# Patient Record
Sex: Male | Born: 1985 | Race: White | Hispanic: No | Marital: Single | Smoking: Current every day smoker
Health system: Southern US, Community
[De-identification: ages and names within clinical notes are randomized; demographics above are authoritative.]

## PROBLEM LIST (undated history)

## (undated) DIAGNOSIS — I4891 Unspecified atrial fibrillation: Secondary | ICD-10-CM

## (undated) DIAGNOSIS — I456 Pre-excitation syndrome: Secondary | ICD-10-CM

## (undated) DIAGNOSIS — I517 Cardiomegaly: Secondary | ICD-10-CM

## (undated) DIAGNOSIS — R011 Cardiac murmur, unspecified: Secondary | ICD-10-CM

## (undated) HISTORY — PX: CARDIAC SURGERY: SHX584

## (undated) HISTORY — PX: PULMONARY ARTERY BALLOON ANGIOPLASTY: SHX277

## (undated) HISTORY — PX: A FLUTTER ABLATION: SHX5348

## (undated) HISTORY — PX: WISDOM TOOTH EXTRACTION: SHX21

---
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
## 2007-04-03 ENCOUNTER — Ambulatory Visit: Payer: Self-pay | Admitting: Internal Medicine

## 2008-08-01 ENCOUNTER — Ambulatory Visit: Payer: Self-pay | Admitting: Cardiology

## 2008-08-01 ENCOUNTER — Inpatient Hospital Stay (HOSPITAL_COMMUNITY): Admission: EM | Admit: 2008-08-01 | Discharge: 2008-08-02 | Payer: Self-pay | Admitting: Internal Medicine

## 2008-08-01 ENCOUNTER — Encounter: Payer: Self-pay | Admitting: Cardiology

## 2008-08-13 ENCOUNTER — Telehealth: Payer: Self-pay | Admitting: Internal Medicine

## 2008-08-13 DIAGNOSIS — R079 Chest pain, unspecified: Secondary | ICD-10-CM | POA: Insufficient documentation

## 2008-08-13 DIAGNOSIS — R42 Dizziness and giddiness: Secondary | ICD-10-CM

## 2008-08-14 ENCOUNTER — Ambulatory Visit: Payer: Self-pay

## 2008-08-14 ENCOUNTER — Ambulatory Visit: Payer: Self-pay | Admitting: Internal Medicine

## 2008-08-14 ENCOUNTER — Encounter: Payer: Self-pay | Admitting: Internal Medicine

## 2008-08-16 ENCOUNTER — Ambulatory Visit: Payer: Self-pay | Admitting: Internal Medicine

## 2008-08-16 ENCOUNTER — Encounter: Payer: Self-pay | Admitting: Internal Medicine

## 2008-08-16 DIAGNOSIS — I379 Nonrheumatic pulmonary valve disorder, unspecified: Secondary | ICD-10-CM | POA: Insufficient documentation

## 2008-08-16 DIAGNOSIS — I471 Supraventricular tachycardia: Secondary | ICD-10-CM

## 2008-08-26 ENCOUNTER — Telehealth: Payer: Self-pay | Admitting: Internal Medicine

## 2008-09-04 ENCOUNTER — Ambulatory Visit: Payer: Self-pay | Admitting: Internal Medicine

## 2008-09-04 DIAGNOSIS — F411 Generalized anxiety disorder: Secondary | ICD-10-CM | POA: Insufficient documentation

## 2008-09-12 ENCOUNTER — Telehealth: Payer: Self-pay | Admitting: Internal Medicine

## 2008-09-17 ENCOUNTER — Encounter: Payer: Self-pay | Admitting: Internal Medicine

## 2008-09-25 ENCOUNTER — Telehealth: Payer: Self-pay | Admitting: Internal Medicine

## 2008-09-26 ENCOUNTER — Encounter: Payer: Self-pay | Admitting: Internal Medicine

## 2008-10-04 ENCOUNTER — Telehealth: Payer: Self-pay | Admitting: Internal Medicine

## 2008-10-05 ENCOUNTER — Emergency Department (HOSPITAL_COMMUNITY): Admission: EM | Admit: 2008-10-05 | Discharge: 2008-10-06 | Payer: Self-pay | Admitting: Emergency Medicine

## 2008-10-08 ENCOUNTER — Telehealth (INDEPENDENT_AMBULATORY_CARE_PROVIDER_SITE_OTHER): Payer: Self-pay | Admitting: *Deleted

## 2008-10-09 ENCOUNTER — Encounter: Payer: Self-pay | Admitting: Internal Medicine

## 2008-10-10 ENCOUNTER — Ambulatory Visit: Payer: Self-pay | Admitting: Internal Medicine

## 2008-10-11 ENCOUNTER — Telehealth (INDEPENDENT_AMBULATORY_CARE_PROVIDER_SITE_OTHER): Payer: Self-pay | Admitting: *Deleted

## 2008-10-11 ENCOUNTER — Ambulatory Visit (HOSPITAL_COMMUNITY): Admission: RE | Admit: 2008-10-11 | Discharge: 2008-10-11 | Payer: Self-pay | Admitting: Internal Medicine

## 2008-10-11 DIAGNOSIS — R0602 Shortness of breath: Secondary | ICD-10-CM | POA: Insufficient documentation

## 2008-10-14 ENCOUNTER — Encounter: Payer: Self-pay | Admitting: Internal Medicine

## 2008-10-17 ENCOUNTER — Emergency Department (HOSPITAL_COMMUNITY): Admission: EM | Admit: 2008-10-17 | Discharge: 2008-10-18 | Payer: Self-pay | Admitting: Emergency Medicine

## 2008-10-25 ENCOUNTER — Ambulatory Visit: Payer: Self-pay | Admitting: Internal Medicine

## 2008-10-29 ENCOUNTER — Encounter: Payer: Self-pay | Admitting: Internal Medicine

## 2008-11-26 ENCOUNTER — Encounter: Payer: Self-pay | Admitting: Internal Medicine

## 2008-12-03 ENCOUNTER — Encounter: Payer: Self-pay | Admitting: Internal Medicine

## 2008-12-24 ENCOUNTER — Telehealth (INDEPENDENT_AMBULATORY_CARE_PROVIDER_SITE_OTHER): Payer: Self-pay | Admitting: *Deleted

## 2009-06-17 ENCOUNTER — Encounter: Payer: Self-pay | Admitting: Internal Medicine

## 2009-09-06 ENCOUNTER — Inpatient Hospital Stay (HOSPITAL_COMMUNITY): Admission: EM | Admit: 2009-09-06 | Discharge: 2009-09-09 | Payer: Self-pay | Admitting: Emergency Medicine

## 2009-09-12 ENCOUNTER — Emergency Department (HOSPITAL_COMMUNITY): Admission: EM | Admit: 2009-09-12 | Discharge: 2009-09-12 | Payer: Self-pay | Admitting: Emergency Medicine

## 2009-09-22 ENCOUNTER — Inpatient Hospital Stay (HOSPITAL_COMMUNITY): Admission: RE | Admit: 2009-09-22 | Discharge: 2009-09-23 | Payer: Self-pay | Admitting: Orthopedic Surgery

## 2010-02-03 NOTE — Letter (Signed)
Summary: Comprehensive Mental Status Evaluation  Comprehensive Mental Status Evaluation   Imported By: Kassie Mends 01/30/2009 08:06:27  _____________________________________________________________________  External Attachment:    Type:   Image     Comment:   External Document

## 2010-02-03 NOTE — Consult Note (Signed)
Summary: Consultation Report  Consultation Report   Imported By: Marylou Mccoy 01/14/2009 14:07:50  _____________________________________________________________________  External Attachment:    Type:   Image     Comment:   External Document

## 2010-02-03 NOTE — Letter (Signed)
[facility]   Imported By: Frazier Butt Chriscoe 06/30/2009 12:05:33  _____________________________________________________________________  External Attachment:    Type:   Image     Comment:   External Document

## 2010-02-03 NOTE — Letter (Signed)
Summary: [facility] Follow-up Clinic Letter/Office Note  Heart Failure Program Alert Report   Imported By: Roderic Ovens 01/15/2009 12:20:10  _____________________________________________________________________  External Attachment:    Type:   Image     Comment:   External Document

## 2010-02-14 ENCOUNTER — Emergency Department (HOSPITAL_COMMUNITY)
Admission: EM | Admit: 2010-02-14 | Discharge: 2010-02-14 | Disposition: A | Discharge status: Home / Self Care | Payer: Medicaid Other | Attending: Emergency Medicine | Admitting: Emergency Medicine

## 2010-02-14 ENCOUNTER — Emergency Department (HOSPITAL_COMMUNITY): Payer: Medicaid Other

## 2010-02-14 DIAGNOSIS — R002 Palpitations: Secondary | ICD-10-CM | POA: Insufficient documentation

## 2010-02-14 DIAGNOSIS — I456 Pre-excitation syndrome: Secondary | ICD-10-CM | POA: Insufficient documentation

## 2010-02-14 DIAGNOSIS — I4891 Unspecified atrial fibrillation: Secondary | ICD-10-CM | POA: Insufficient documentation

## 2010-02-14 DIAGNOSIS — R110 Nausea: Secondary | ICD-10-CM | POA: Insufficient documentation

## 2010-02-14 DIAGNOSIS — R0609 Other forms of dyspnea: Secondary | ICD-10-CM | POA: Insufficient documentation

## 2010-02-14 DIAGNOSIS — R42 Dizziness and giddiness: Secondary | ICD-10-CM | POA: Insufficient documentation

## 2010-02-14 DIAGNOSIS — R0989 Other specified symptoms and signs involving the circulatory and respiratory systems: Secondary | ICD-10-CM | POA: Insufficient documentation

## 2010-02-14 DIAGNOSIS — R079 Chest pain, unspecified: Secondary | ICD-10-CM | POA: Insufficient documentation

## 2010-02-14 DIAGNOSIS — R000 Tachycardia, unspecified: Secondary | ICD-10-CM | POA: Insufficient documentation

## 2010-02-14 LAB — DIFFERENTIAL
Eosinophils Absolute: 0 10*3/uL (ref 0.0–0.7)
Eosinophils Relative: 0 % (ref 0–5)
Lymphs Abs: 3 10*3/uL (ref 0.7–4.0)
Monocytes Absolute: 0.7 10*3/uL (ref 0.1–1.0)

## 2010-02-14 LAB — POCT CARDIAC MARKERS
CKMB, poc: <1 ng/mL — ABNORMAL LOW (ref 1.0–8.0)
Troponin i, poc: <0.05 ng/mL (ref 0.00–0.09)

## 2010-02-14 LAB — POCT I-STAT, CHEM 8
BUN: 22 mg/dL (ref 6–23)
Calcium, Ion: 1.11 mmol/L — ABNORMAL LOW (ref 1.12–1.32)
Chloride: 107 mEq/L (ref 96–112)
Creatinine, Ser: 1 mg/dL (ref 0.4–1.5)
Glucose, Bld: 86 mg/dL (ref 70–99)
HCT: 43 % (ref 39.0–52.0)
TCO2: 22 mmol/L (ref 0–100)

## 2010-02-14 LAB — CBC
HCT: 40.8 % (ref 39.0–52.0)
MCHC: 33.8 g/dL (ref 30.0–36.0)

## 2010-02-18 ENCOUNTER — Encounter: Payer: Self-pay | Admitting: Internal Medicine

## 2010-02-19 NOTE — Consult Note (Signed)
NAME:  Erik Bryan, Erik Bryan NO.:  [id number]  MEDICAL RECORD NO.:  [id number]           PATIENT TYPE:  E  LOCATION:  [facility]                         FACILITY:  [facility]  PHYSICIAN:  [name]. [name], MD    DATE OF BIRTH:  1985-06-27  DATE OF CONSULTATION:  02/14/2010 DATE OF DISCHARGE:  02/14/2010                                CONSULTATION   ELECTROPHYSIOLOGY/CARDIOLOGY EMERGENCY ROOM CONSULTATION  REQUESTING PHYSICIAN:  Emergency room physician.  INDICATION FOR CONSULTATION:  Evaluation of atrial fibrillation with rapid ventricular response.  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  He also has a history of tricuspid regurgitation. The patient has a history of WPW syndrome and documented SVT and underwent EP study and catheter ablation by Dr. Johney Frame back in July 2010.  At that time, he was found to have a right free wall (paraseptal) accessory pathway which was successfully ablated by his report.  Since then, the patient has had no more rapid SVT, but he has had palpitations and at times has had documented atrial fibrillation.  The patient had atrial fibrillation during his EP study interestingly enough.  The patient was in his usual state of health until several days ago when he noted increasingly frequent episodes of palpitations.  On the date of admission to the emergency room, he was complaining of worsening palpitations and initial EKG demonstrates sinus rhythm, however, subsequent EKGs demonstrated atrial fibrillation with a rapid ventricular response and he is referred now for additional evaluation. The patient denies a history of syncope.  His most recent past medical history is notable for a left bimalleolar ankle fracture, treated by Dr. Ranell Patrick back in September 2011.  His additional past medical history is, otherwise,  unremarkable.  FAMILY HISTORY:  Negative for premature coronary disease.  SOCIAL HISTORY:  The patient denies tobacco or ethanol abuse.  REVIEW OF SYSTEMS:  All systems reviewed and negative except as noted in the HPI.  PHYSICAL EXAMINATION:  GENERAL:  He is a pleasant well-appearing young man, in no distress. VITAL SIGNS:  The blood pressure was 114/82, the pulse was 120 and irregularly irregular, respirations were 18, temperature is 98. HEENT:  Normocephalic and atraumatic.  Pupils equal, round.  Oropharynx is moist.  Sclerae anicteric. NECK:  No jugular distention.  There is no thyromegaly.  Trachea is midline and carotids are 2+ and symmetric. LUNGS:  Clear bilaterally to auscultation.  No wheezes, rales, or rhonchi are present. CARDIOVASCULAR:  Irregularly irregular rhythm with normal S1 and S2. There was a grade 2/6 systolic murmur, heard best at left upper sternal border.  I did not appreciate a diastolic murmur. ABDOMEN:  Soft, nontender.  There is no organomegaly. EXTREMITIES:  No cyanosis, clubbing, or edema.  There was a scar over his left ankle. NEUROLOGIC:  He is alert and oriented x3 with cranial nerves intact. Strength is 5/5 and symmetric.  The EKG demonstrates atrial fibrillation with a rapid ventricular response.  There is no over pre-excitation present.  IMPRESSION: 1. Atrial fibrillation with a  history of Wolff-Parkinson-White     syndrome. 2. Pulmonic insufficiency and stenosis.  DISCUSSION:  The patient has had persistent atrial fibrillation.  I have reviewed his EKG, I do not see any evidence of accessory pathway conduction though he does have an incomplete right bundle-branch block on his EKG.  I have recommended giving the patient flecainide 300 mg p.o. in the emergency room and follow him on the monitor.  He may also require flecainide either taken p.r.n. or on a regular basis at home. The patient will likely revert back to sinus rhythm if he does  not, then we will plan a DC cardioversion over the next few hours.  I will have him see Dr. Johney Frame back in the office in a couple of weeks assuming that we are able to get him back to sinus rhythm.     [name]. [name], MD     GWT/MEDQ  D:  02/14/2010  T:  02/14/2010  Job:  [id number]  Electronically Signed by Lewayne Bunting MD on 02/19/2010 05:45:06 PM

## 2010-03-13 NOTE — Discharge Summary (Signed)
  NAME:  Erik Bryan, LASWELL NO.:  [id number]  MEDICAL RECORD NO.:  [id number]           PATIENT TYPE:  E  LOCATION:  [facility]                         FACILITY:  [facility]  PHYSICIAN:  Pricilla Riffle, MD, FACCDATE OF BIRTH:  Jan 13, 1985  DATE OF ADMISSION:  02/14/2010 DATE OF DISCHARGE:  02/14/2010                              DISCHARGE SUMMARY   IDENTIFICATION:  The patient is a 25 year old with a history of pulmonic stenosis/pulmonic insufficiency, WPW (status post ablation) who presents with atrial fibrillation.  The patient was sedated per Dr. Freida Busman (ER) with 9 mg of Versed IV, 100 mg of fentanyl IV, and 10 mg of etomidate IV.  With pads in the AP position, attempt at cardioversion was made with 200- joule synchronized biphasic energy.  This was unsuccessful.  With pads in the base-apex position, he was again cardioverted with 200-joule biphasic synchronized energy.  This was successful for conversion of sinus rhythm.  A 12-lead EKG pending.     Pricilla Riffle, MD, [facility]     PVR/MEDQ  D:  02/14/2010  T:  02/15/2010  Job:  [id number]  Electronically Signed by Dietrich Pates MD [facility] on 03/12/2010 02:13:08 PM

## 2010-03-17 NOTE — Letter (Signed)
Summary: [facility] Cardiovascual[facility] Clinic Note   [facility] Clinic Note   Imported By: Roderic Ovens 03/12/2010 11:26:53  _____________________________________________________________________  External Attachment:    Type:   Image     Comment:   External Document

## 2010-03-19 LAB — BASIC METABOLIC PANEL
BUN: 10 mg/dL (ref 6–23)
BUN: 10 mg/dL (ref 6–23)
Calcium: 9.8 mg/dL (ref 8.4–10.5)
Chloride: 107 mEq/L (ref 96–112)
GFR calc Af Amer: >60 mL/min (ref >60)
GFR calc Af Amer: >60 mL/min (ref >60)
GFR calc non Af Amer: >60 mL/min (ref >60)
Glucose, Bld: 86 mg/dL (ref 70–99)
Glucose, Bld: 105 mg/dL — ABNORMAL HIGH (ref 70–99)
Potassium: 3.9 mEq/L (ref 3.5–5.1)
Potassium: 3.5 mEq/L (ref 3.5–5.1)
Sodium: 136 mEq/L (ref 135–145)
Sodium: 139 mEq/L (ref 135–145)

## 2010-03-19 LAB — DIFFERENTIAL
Basophils Absolute: 0 10*3/uL (ref 0.0–0.1)
Basophils Absolute: 0 10*3/uL (ref 0.0–0.1)
Eosinophils Absolute: 0.2 10*3/uL (ref 0.0–0.7)
Eosinophils Relative: 2 % (ref 0–5)
Eosinophils Relative: 1 % (ref 0–5)
Lymphocytes Relative: 24 % (ref 12–46)
Lymphs Abs: 2.2 10*3/uL (ref 0.7–4.0)
Lymphs Abs: 2.1 10*3/uL (ref 0.7–4.0)
Monocytes Absolute: 0.5 10*3/uL (ref 0.1–1.0)
Monocytes Relative: 6 % (ref 3–12)
Neutro Abs: 6.2 10*3/uL (ref 1.7–7.7)
Neutrophils Relative %: 69 % (ref 43–77)
Neutrophils Relative %: 69 % (ref 43–77)

## 2010-03-19 LAB — CBC
HCT: 42.1 % (ref 39.0–52.0)
Hemoglobin: 14.1 g/dL (ref 13.0–17.0)
Hemoglobin: 14.6 g/dL (ref 13.0–17.0)
MCH: 29.9 pg (ref 26.0–34.0)
MCHC: 33.7 g/dL (ref 30.0–36.0)
MCHC: 34.6 g/dL (ref 30.0–36.0)
MCV: 88.7 fL (ref 78.0–100.0)
MCV: 89.1 fL (ref 78.0–100.0)
Platelets: 272 10*3/uL (ref 150–400)
Platelets: 194 10*3/uL (ref 150–400)
RBC: 4.71 MIL/uL (ref 4.22–5.81)

## 2010-03-19 LAB — PROTIME-INR: Prothrombin Time: 13.4 seconds (ref 11.6–15.2)

## 2010-03-19 LAB — URINALYSIS, ROUTINE W REFLEX MICROSCOPIC
Hgb urine dipstick: NEGATIVE
Ketones, ur: NEGATIVE mg/dL
Protein, ur: NEGATIVE mg/dL

## 2010-03-19 LAB — APTT: aPTT: 30 seconds (ref 24–37)

## 2010-03-23 ENCOUNTER — Encounter (HOSPITAL_COMMUNITY)
Admission: RE | Admit: 2010-03-23 | Discharge: 2010-03-23 | Disposition: A | Discharge status: Home / Self Care | Payer: Medicaid Other | Source: Ambulatory Visit | Attending: Oral and Maxillofacial Surgery | Admitting: Oral and Maxillofacial Surgery

## 2010-03-23 LAB — CBC
HCT: 44.5 % (ref 39.0–52.0)
Hemoglobin: 15 g/dL (ref 13.0–17.0)
MCH: 30.1 pg (ref 26.0–34.0)
MCV: 89.4 fL (ref 78.0–100.0)
WBC: 6.8 10*3/uL (ref 4.0–10.5)

## 2010-03-23 LAB — PROTIME-INR
INR: 0.94 (ref 0.00–1.49)
Prothrombin Time: 12.8 seconds (ref 11.6–15.2)

## 2010-03-23 LAB — BASIC METABOLIC PANEL
BUN: 8 mg/dL (ref 6–23)
Creatinine, Ser: 1.12 mg/dL (ref 0.4–1.5)
GFR calc Af Amer: >60 mL/min (ref >60)
GFR calc non Af Amer: >60 mL/min (ref >60)
Potassium: 4.8 mEq/L (ref 3.5–5.1)

## 2010-03-24 NOTE — Letter (Addendum)
Summary: Clinic Note - Cardio  Clinic Note - Cardio   Imported By: Earl Many 03/11/2010 09:07:40  _____________________________________________________________________  External Attachment:    Type:   Image     Comment:   External Document  Appended Document: Clinic Note - Cardio Jackie Please send ER consult note  and EKG with afib to T. Bashore  Appended Document: Clinic Note - Cardio Faxed hospital notes and 3 ekg's to [facility] at [phone].

## 2010-03-26 ENCOUNTER — Ambulatory Visit (HOSPITAL_BASED_OUTPATIENT_CLINIC_OR_DEPARTMENT_OTHER)
Admission: RE | Admit: 2010-03-26 | Payer: Medicaid Other | Source: Ambulatory Visit | Admitting: Oral and Maxillofacial Surgery

## 2010-03-26 ENCOUNTER — Ambulatory Visit (HOSPITAL_COMMUNITY)
Admission: RE | Admit: 2010-03-26 | Discharge: 2010-03-26 | Disposition: A | Discharge status: Home / Self Care | Payer: Medicaid Other | Source: Ambulatory Visit | Attending: Oral and Maxillofacial Surgery | Admitting: Oral and Maxillofacial Surgery

## 2010-03-26 DIAGNOSIS — I471 Supraventricular tachycardia, unspecified: Secondary | ICD-10-CM | POA: Insufficient documentation

## 2010-03-26 DIAGNOSIS — K006 Disturbances in tooth eruption: Secondary | ICD-10-CM | POA: Insufficient documentation

## 2010-03-26 DIAGNOSIS — K0530 Chronic periodontitis, unspecified: Secondary | ICD-10-CM | POA: Insufficient documentation

## 2010-04-09 LAB — DIFFERENTIAL
Basophils Absolute: 0 10*3/uL (ref 0.0–0.1)
Basophils Relative: 1 % (ref 0–1)
Eosinophils Absolute: 0.2 10*3/uL (ref 0.0–0.7)
Eosinophils Absolute: 0.2 10*3/uL (ref 0.0–0.7)
Eosinophils Relative: 3 % (ref 0–5)
Lymphs Abs: 2 10*3/uL (ref 0.7–4.0)
Monocytes Absolute: 0.4 10*3/uL (ref 0.1–1.0)
Monocytes Relative: 8 % (ref 3–12)
Neutro Abs: 4.1 10*3/uL (ref 1.7–7.7)
Neutrophils Relative %: 60 % (ref 43–77)

## 2010-04-09 LAB — CBC
MCV: 91.3 fL (ref 78.0–100.0)
Platelets: 169 10*3/uL (ref 150–400)
RBC: 4.43 MIL/uL (ref 4.22–5.81)
RBC: 4.68 MIL/uL (ref 4.22–5.81)
WBC: 6.4 10*3/uL (ref 4.0–10.5)
WBC: 6.8 10*3/uL (ref 4.0–10.5)

## 2010-04-09 LAB — BASIC METABOLIC PANEL
Chloride: 104 mEq/L (ref 96–112)
Creatinine, Ser: 1.12 mg/dL (ref 0.4–1.5)
GFR calc Af Amer: >60 mL/min (ref >60)
Potassium: 3.8 mEq/L (ref 3.5–5.1)

## 2010-04-09 LAB — COMPREHENSIVE METABOLIC PANEL
ALT: 11 U/L (ref 0–53)
AST: 20 U/L (ref 0–37)
Albumin: 4.2 g/dL (ref 3.5–5.2)
Alkaline Phosphatase: 57 U/L (ref 39–117)
Chloride: 109 mEq/L (ref 96–112)
GFR calc Af Amer: >60 mL/min (ref >60)
Potassium: 4 mEq/L (ref 3.5–5.1)
Total Bilirubin: 0.5 mg/dL (ref 0.3–1.2)

## 2010-04-09 LAB — URINALYSIS, ROUTINE W REFLEX MICROSCOPIC
Glucose, UA: NEGATIVE mg/dL
Hgb urine dipstick: NEGATIVE
Specific Gravity, Urine: 1.028 (ref 1.005–1.030)
pH: 6 (ref 5.0–8.0)

## 2010-04-09 LAB — POCT CARDIAC MARKERS: Troponin i, poc: <0.05 ng/mL (ref 0.00–0.09)

## 2010-04-12 LAB — DIFFERENTIAL
Basophils Relative: 0 % (ref 0–1)
Eosinophils Absolute: 0.2 10*3/uL (ref 0.0–0.7)
Monocytes Absolute: 0.5 10*3/uL (ref 0.1–1.0)
Monocytes Relative: 7 % (ref 3–12)

## 2010-04-12 LAB — POCT CARDIAC MARKERS
Myoglobin, poc: 46.9 ng/mL (ref 12–200)
Myoglobin, poc: 49.1 ng/mL (ref 12–200)
Troponin i, poc: <0.05 ng/mL (ref 0.00–0.09)
Troponin i, poc: <0.05 ng/mL (ref 0.00–0.09)

## 2010-04-12 LAB — CBC
Hemoglobin: 14.9 g/dL (ref 13.0–17.0)
MCHC: 35.9 g/dL (ref 30.0–36.0)
MCV: 92.1 fL (ref 78.0–100.0)
RBC: 4.52 MIL/uL (ref 4.22–5.81)

## 2010-04-12 LAB — BASIC METABOLIC PANEL
CO2: 28 mEq/L (ref 19–32)
Chloride: 107 mEq/L (ref 96–112)
GFR calc Af Amer: >60 mL/min (ref >60)
Potassium: 3.7 mEq/L (ref 3.5–5.1)
Sodium: 140 mEq/L (ref 135–145)

## 2010-04-12 LAB — DRUGS OF ABUSE SCREEN W/O ALC, ROUTINE URINE
Amphetamine Screen, Ur: NEGATIVE
Benzodiazepines.: NEGATIVE
Methadone: NEGATIVE
Phencyclidine (PCP): NEGATIVE
Propoxyphene: NEGATIVE

## 2010-04-12 LAB — BRAIN NATRIURETIC PEPTIDE: Pro B Natriuretic peptide (BNP): <30 pg/mL (ref 0.0–100.0)

## 2010-04-23 ENCOUNTER — Ambulatory Visit (HOSPITAL_COMMUNITY)
Admission: RE | Admit: 2010-04-23 | Payer: Medicaid Other | Source: Ambulatory Visit | Admitting: Oral and Maxillofacial Surgery

## 2010-04-30 NOTE — Op Note (Signed)
NAME:  Erik Bryan, Erik Bryan NO.:  [id number]  MEDICAL RECORD NO.:  [id number]           PATIENT TYPE:  O  LOCATION:  [facility]                         FACILITY:  [facility]  PHYSICIAN:  Lincoln Brigham, DDSDATE OF BIRTH:  Oct 23, 1985  DATE OF PROCEDURE:  03/26/2010 DATE OF DISCHARGE:                              OPERATIVE REPORT   PREOPERATIVE DIAGNOSIS:  The patient with impacted third molars, #1, 16, 17, and 32.  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  PROCEDURE:  Extraction of teeth numbers 1, 16, 17, and 32.  INDICATIONS FOR SURGERY:  The patient is a 25 year old white male referred for extraction of 1, 16, 17, and 32 from Dr. Elease Etienne.  The patient has a significant medical history for Wolff-Parkinson-White, which the patient did have ablated several years ago.  However, the patient also has paroxysmal supraventricular tachycardia for which he takes flecainide 100 mg twice daily.  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  SURGEON:  Lincoln Brigham, DDS  PROCEDURE IN DETAIL:  The patient was seen in the preoperative holding area by Anesthesia and myself.  The patient's consent was verified.  The patient's history and physical were updated.  All of the patient's questions were answered.  The patient was then taken to the operating room by anesthesia and intubated nasally and placed under general anesthesia.  At this point, the patient was then turned over to the care of myself.  The patient was then prepped and draped in the usual sterile fashion for oral and maxillofacial surgery procedures.  The patient then had a moistened Ray-Tec placed in his oropharynx.  A time-out  was performed as well.  At this point, a rubber bite block was then placed in the patient's mouth on the right side and then local anesthesia was then provided to the patient, approximately 7.2 mL of 0.5% Marcaine with 1:200,000 epinephrine was used to anesthetize the patient's mouth as well as 10.8 mL of 2% lidocaine with 1:100,000 epinephrine were also used to provide short and long-term local anesthesia into the patient's mouth in the areas of #1, 16, 17, and 32.  At this point, a 15 blade was then used to make a full-thickness mucoperiosteal incision with a distal buccal release in the area of #17.  Periosteal was used to elevate a flap and then a hand piece with a fissure bur was used to create a trough and section the tooth into various pieces.  The crown and all the roots were removed from the extraction site #17.  Copious irrigation was then used along with a curette to clean the socket.  Then, the socket was sutured with 3-0 chromic gut.  This was repeated for teeth numbers 16, 32, and #1.  Of note, only sutures were placed at sites #17 and 32. #1 and 16 did not have sutures placed.  The patient was hemostatic  following the conclusion of the procedure.  The throat pack was then removed from the patient's mouth and his oropharynx was suctioned with Yankauer suction.  At this point, the bite block was removed and 4 x 4's were taken and placed in the patient's mouth to allow for further hemostasis.  All counts were correct at the conclusion of the case x2. The patient was then extubated by anesthesia and taken to the recovery area in stable fashion.  SPECIMENS:  Number 1, 16, 17, and 32 were then discarded.  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  The patient's condition following the procedure is stable.  COMPLICATIONS:  None.          ______________________________ Lincoln Brigham, DDS     CD/MEDQ  D:  03/26/2010  T:   03/27/2010  Job:  [id number]  Electronically Signed by Lincoln Brigham DDS on 04/30/2010 01:58:48 PM

## 2010-04-30 NOTE — Discharge Summary (Signed)
  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  PROCEDURE:  The patient went to the operating room on March 26, 2010, for extraction of numbers 1, 16, 17, and 32.  INDICATIONS FOR SURGERY:  The patient is a 25 year old white male with a history of Wolff-Parkinson-White which was previously ablated, also with a history significant for paroxysmal supraventricular tachycardia for which the patient takes flecainide 100 mg twice daily and the patient was born with several cardiac congenital defects which were treated at his young age and the patient is stable, however, in light of those cardiac histories it was appropriate to take to the operating room for extraction of number 1, 16, 17, and 32.  The patient did well following the procedure.  The patient was then discharged to home on March 26, 2010, without complication.  The patient was given followup in our office 1 week prior to surgery.  The patient was also prescribed prior to his surgery amoxicillin 500 mg.  The patient was to take 1 tablet 3 times daily.  The patient also was prescribed Lorcet 10/325.  The patient was to take 1 tablet every 4 hours as needed for pain along with Peridex oral mouth rinse to use twice daily and Decadron 4 mg which would take 1 tablet 3 times daily x3 days.          ______________________________ Lincoln Brigham, DDS     CD/MEDQ  D:  03/26/2010  T:  03/27/2010  Job:  [id number]  Electronically Signed by Lincoln Brigham DDS on 04/30/2010 01:58:49 PM

## 2010-05-19 NOTE — Op Note (Signed)
NAME:  LYDIA, MENG NO.:  [id number]   MEDICAL RECORD NO.:  [id number]          PATIENT TYPE:  INP   LOCATION:  2012                         FACILITY:  [facility]   PHYSICIAN:  Hillis Range, MD       DATE OF BIRTH:  October 11, 1985   DATE OF PROCEDURE:  DATE OF DISCHARGE:  08/02/2008                               OPERATIVE REPORT   SURGEON:  Hillis Range, MD   PREPROCEDURE DIAGNOSES:  1. Paroxysmal atrial fibrillation.  2. Wolff-Parkinson-White syndrome.   POSTPROCEDURE DIAGNOSES:  1. Paroxysmal atrial fibrillation.  2. Right paraseptal accessory pathway.   PROCEDURES:  1. Comprehensive electrophysiology study.  2. Coronary sinus pacing recording.  3. A 3-D mapping of supraventricular tachycardia.  4. Radiofrequency ablation of supraventricular tachycardia.  5. Arrhythmia induction with pacing.  6. Cardioversion.   INTRODUCTION:  Mr. Schuneman is a pleasant 25 year old gentleman with a  history of pulmonary stenosis and regurgitation who was admitted with  symptomatic palpitations and presyncope.  The patient was found to have  moderate preexcitation by surface EKG, which has not been present on  previous EKGs.  He was observed to have nonsustained atrial fibrillation  on telemetry as well as orthodromic reentrant tachycardia.  He,  therefore, presents for EP study and radiofrequency ablation.   DESCRIPTION OF PROCEDURE:  Informed written consent was obtained, and  the patient was brought to the Electrophysiology Lab in the fasting  state.  He was adequately sedated with intravenous Versed, fentanyl, and  Valium as outlined in the nursing report.  The patient's right neck and  groin were prepped and draped in the usual sterile fashion by the EP lab  staff.  A 6-French curved hexapolar Damato catheter was introduced  through the right internal jugular vein and advanced into the coronary  sinus for recording and pacing from this location.  Two 6-French  quadripolar Josephson catheters were introduced through the right common  femoral vein and advanced into the His bundle and right ventricular apex  positions respectively.  The patient presented to the Electrophysiology  Lab in normal sinus rhythm.  He was noted to have moderate preexcitation  on his surface electrogram.  His PR interval was 107 milliseconds with a  QRS duration of 148 milliseconds.  His QT interval measured 334  milliseconds.  The patient's AH interval measured 69 milliseconds with  an HV interval of 25 milliseconds.  The surface electrogram revealed  preexcitation with a delta wave positive in leads I, II, III, aVF, aVL.  AVR and leads V1 were negative with a transition to positive delta wave  in V2 through V6.  This transition occurred between V1 and V2.  Atrial  pacing was observed, and the patient was found to have maximal  preexcitation when pacing from the proximal coronary sinus at a cycle  length of 700 milliseconds.  There was no AV decrement with atrial  pacing, and the AV Wenckebach cycle length was 340 milliseconds.  Ventricular pacing was performed, which revealed VA conduction at  baseline with the earliest retrograde atrial activation recorded at the  CS56 electrode pair.  Ventricular pacing was performed down to a cycle  length of 300 milliseconds with VA conduction intact throughout.  There  was no significant VA decremental with ventricular pacing.  Atrial  extrastimulus testing was then performed, which revealed that the  antegrade accessory pathway ERP was 500/340 milliseconds.  The AV nodal  ERP was 500/310 milliseconds.  No tachycardias were induced.  Ventricular extrastimulus testing was then performed from the right  ventricular apex position, which revealed that the earliest retrograde  atrial activation was again from the proximal coronary sinus and not the  His electrode pair.  VA pacing was nondecremental, down to 500/340  milliseconds and then  decremental thereafter.  The retrograde ERP of the  accessory pathway was, therefore, felt to be 500/340 milliseconds.  The  retrograde AV nodal ERP was felt to be less than 500/180 milliseconds.  No tachycardias were induced.  I, therefore, elected to perform 3-  dimensional mapping of the right atrium and as well as the tricuspid  valve annulus along the ventricular side.  Atrial pacing was performed  at a cycle length of 700 milliseconds from the proximal coronary sinus  to reveal maximal preexcitation.  The earliest ventricular activation  was found along the tricuspid valve annulus at approximately the 4  o'clock position along the paraseptal region.  In this location, a  slurred activation was observed with the activation preceding the  surface QRS by 7 milliseconds.  Radiofrequency current was delivered and  the accessory pathway, delta wave terminated in 2.8 seconds.  The  radiofrequency application was, therefore, delivered at a target  temperature of 60 watts at 50 degrees.  The initial ablation lesion was  delivered for 29 seconds.  Additional radiofrequency applications were  delivered along this same region as bonus lesions.  During the ablation,  the patient converted to atrial fibrillation.  He remained in atrial  fibrillation but did not have obvious surface preexcitation.  He was  observed in atrial fibrillation, which did not spontaneously terminate.  He was, therefore, successfully cardioverted to sinus rhythm with a  single synchronized 200-joule biphasic shock with cardioversion  electrodes in the anterior-posterior thoracic configuration.  Following  ablation, the HV interval measured 41 milliseconds.  Atrial  extrastimulus testing was then performed, which revealed clearly  decremental AV conduction with no arrhythmias induced.  There were no  clear AH jumps or echo beats.  The AV nodal ERP was 500/300.  Ventricular pacing was performed, which revealed that the  earliest  atrial activation arose from the His distal electrogram.  Ventricular  pacing revealed decremental VA conduction.  Additional radiofrequency  applications were then delivered along the tricuspid valve annulus  between the 4 and 5 o'clock position along the atrial and ventricular  sides at a target temperature of 60 degrees with a power 50 watts for 60  seconds each.  Following ablation, pacing was performed at 900, 800,  700, 600, 500 millisecond cycle lengths without any observed surface  preexcitation.  Rapid atrial pacing was again revealed that the AV  Wenckebach cycle length was 360 milliseconds and no tachycardias were  induced.  Atrial extrastimulus testing was performed, which revealed  decremental AV conduction with an AV nodal ERP of 500/290 milliseconds.  Ventricular pacing was performed, which revealed that the earliest  atrial activation arose from the His electrode.  The VA Wenckebach cycle  length was 270 milliseconds.  Ventricular extrastimulus testing was  again performed, which revealed clearly decremental VA conduction with  no  tachycardias observed.  The retrograde AV nodal ERP was less than  500/200 milliseconds.  The patient was observed for 30 minutes without  return of conduction through the accessory pathway.  At the end of the  procedure, the AH interval measured 76 milliseconds and the HV interval  measured 49 milliseconds.  The procedure was, therefore, considered  completed.  All catheters were removed, and the sheaths were aspirated  and flushed.  The sheaths were removed, and hemostasis was assured.  There were no early apparent complications.   CONCLUSIONS:  1. Sinus rhythm upon presentation with a surface preexcitation      observed.  2. A right paraseptal accessory pathway is observed and successfully      ablated between the 4 and 5 o'clock position along the tricuspid      valve annulus from the ventricular side.  Additional ablation was       performed along the atrial and ventricular insertion sites.  3. No further accessory pathways are observed and no arrhythmias are      inducible following ablation.  4. Atrial fibrillation was induced with ablation and successfully      cardioverted to sinus rhythm.  5. No inducible arrhythmias following ablation.  6. No early apparent complications.      Hillis Range, MD  Electronically Signed     JA/MEDQ  D:  08/02/2008  T:  08/03/2008  Job:  [id number]   cc:   Pricilla Riffle, MD, [facility]
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
## 2010-05-19 NOTE — Assessment & Plan Note (Signed)
[facility]                            CARDIOLOGY OFFICE NOTE   NAME:Erik Bryan, Erik Bryan                     MRN:          [id number]  DATE:04/03/2007                            DOB:          1985-03-11    IDENTIFICATION:  Erik Bryan is a 25 year old with a history of pulmonic  stenosis and moderate pulmonic insufficiency.  I last saw him in  February of last year.   In the interval, he has done okay.  He still works as Personnel officer.  He denies chest pain.  Notes some shortness of breath but with a lot of  exertion, not at other times.   MEDICATIONS:  None.   ALLERGIES:  None.   PHYSICAL EXAMINATION:  The patient is in no distress.  Blood pressure  118/70, pulse is 70 and regular, weight 175.  LUNGS:  Are clear.  No rales or wheezes.  CARDIAC EXAM:  Regular rate and rhythm, grade 2-3/6 systolic murmur  heard best at the left upper sternal border.  No diastolic murmurs are  audible.  No significant RV heave.  ABDOMEN:  Benign.  EXTREMITIES:  No edema.   IMPRESSION:  Pulmonic stenosis.  Again, very important will be an  echocardiogram to evaluate the RV size, function, and the regurgitant  jet.   I have told him to continue activities as tolerated.  I will be in touch  with him again.  He is self-pay, and I will see regarding payment.     Pricilla Riffle, MD, [facility]  Electronically Signed    PVR/MedQ  DD: 04/05/2007  DT: 04/06/2007  Job #: [id number]

## 2010-05-19 NOTE — Consult Note (Signed)
NAME:  Erik Bryan, Erik Bryan NO.:  [id number]   MEDICAL RECORD NO.:  [id number]          PATIENT TYPE:  INP   LOCATION:  2012                         FACILITY:  [facility]   PHYSICIAN:  Hillis Range, MD       DATE OF BIRTH:  1985/10/11   DATE OF CONSULTATION:  DATE OF DISCHARGE:                                 CONSULTATION   REQUESTING PHYSICIAN:  Arturo Morton. Riley Kill, MD, [facility]   REASON FOR CONSULTATION:  Wolff-Parkinson-White syndrome.   HISTORY OF PRESENT ILLNESS:  Mr. Hayter is a pleasant 25 year old  gentleman with a known history of pulmonary valve stenosis and moderate  pulmonary valvular regurgitation who presents with symptoms of  palpitations.  The patient reports being in his usual state of health  until yesterday evening when he developed 5-6 minutes of rapid irregular  palpitations with shortness of breath, chest discomfort, and presyncope.  He notes that the symptoms occurred around 9:00 p.m.  Approximately 3  hours later, he had recurrent palpitations lasting several minutes.  He  is unaware of any triggers or precipitants but does report having an  excessive amount of caffeine lately.  He notes that over the past few  years he has had intermittent palpitations, which typically last less  than a minute.  He has not tried vagal maneuvers in the past.  He denies  any prior episodes of presyncope or syncope.  He is presently resting  comfortably, and otherwise, without complaint.   PAST MEDICAL HISTORY:  1. Moderate pulmonary valvular stenosis and regurgitation.  2. Moderate tricuspid regurgitation.   MEDICINES:  None.   ALLERGIES:  None.   SOCIAL HISTORY:  The patient lives with his girlfriend in [place].  He has a daughter less than 32 years old.  He works in a [employer] in  [place].  He denies tobacco or alcohol use.  He has used marijuana in  the past.   FAMILY HISTORY:  He denies any family history of sudden death or  arrhythmias.   REVIEW OF  SYSTEMS:  All systems are reviewed and negative except as  outlined in the HPI above.   PHYSICAL EXAMINATION:  Telemetry reveals sinus rhythm with no  arrhythmias.  VITAL SIGNS:  Blood pressure 132/80, heart rate 70, respirations 20,  sats 97% on room air, afebrile.  GENERAL:  The patient is a well-appearing male in no acute distress.  He  is alert and oriented x3.  HEENT:  Normocephalic, atraumatic.  Sclerae clear.  Conjunctivae pink.  Oropharynx clear.  NECK:  Supple.  No thyromegaly, JVD, or bruits.  LUNGS:  Clear to auscultation bilaterally.  HEART:  Regular rate and rhythm.  There is a 3/6 diastolic murmur along  the left upper sternal border as well as a 2/6 systolic murmur along the  left upper sternal border.  GI:  Soft, nontender, nondistended.  Positive bowel sounds.  EXTREMITIES:  No clubbing, cyanosis, or edema.  NEUROLOGIC:  Cranial nerves II through XII are intact.  Strength and  sensation are intact.  SKIN:  No ecchymoses or lacerations.  MUSCULOSKELETAL:  No deformity or  atrophy.  PSYCH:  Euthymic mood.  Full affect.   IMAGING:  EKG, I reviewed the patient's EKG from earlier today, which  reveals mild cardiomegaly but no acute pulmonary process.   I have also reviewed the patient's echocardiogram from 2005 which  reveals a preserved ejection fraction with moderate PI, PS, and moderate  TR.  The echocardiogram from today remains pending.   LABORATORY DATA:  Creatinine 1.  White blood cell count 7.4, hematocrit  41, platelets 165.   EKG, I reviewed the patient's EKG from this admission, which reveals  sinus rhythm at 80 beats per minute.  There is a delta wave and  preexcitation is noted.   IMPRESSION:  Mr. Goracke is a 25 year old gentleman who is admitted with  palpitations and presyncope.  He has a preexcitation observed on his  surface EKG.  I am concerned that he has a Wolff-Parkinson-White  syndrome.  This would place him at an incremental risk for sudden  death.  I have advised the patient to consider catheter ablation.  He and his  family have had a long discussion.  They understand that the risk  include but are not limited to bleeding, vascular damage, tamponade,  perforation, damage to the heart, and it is conduction system possibly  requiring a pacemaker, stroke, myocardial infarction, and death.  He  understands these risks and wishes to proceed.  We will, therefore, plan  for electrophysiology study and radiofrequency ablation at the next  available time.      Hillis Range, MD  Electronically Signed     JA/MEDQ  D:  08/01/2008  T:  08/02/2008  Job:  [id number]   cc:   Arturo Morton. Riley Kill, MD, [facility]  Pricilla Riffle, MD, [facility]

## 2010-05-19 NOTE — Letter (Signed)
October 14, 2008     RE:  [name], HIPPERT  MRN:  [id number]  /  DOB:  09-Jan-1985   To whom it may concern:   Mr. Erik Bryan is a pleasant 25 year old gentleman with a history of  Wolff-Parkinson-White syndrome and palpitations.  He is status post EP  study and radiofrequency ablation for Wolff-Parkinson-White syndrome on  August 02, 2008.  Recently, he reports symptoms of palpitations and chest  discomfort.  I am concerned that he may have recurrent arrhythmias.  I  have therefore recommended placement of an event monitor.  Unfortunately, the patient has no insurance and is unable to pay for an  event monitor at this time.  I have agreed to not charge the patient for  profession or technical fees associated with an event monitor if the  monitor could be provided by [employer].  Your assistance would be  greatly appreciated in providing the patient with an event monitor at no  cost at this time.    Sincerely,      Hillis Range, MD  Electronically Signed    JA/MedQ  DD: 10/14/2008  DT: 10/15/2008  Job #: [id number]

## 2010-05-19 NOTE — Discharge Summary (Signed)
NAME:  Erik Bryan, Erik Bryan NO.:  [id number]   MEDICAL RECORD NO.:  [id number]          PATIENT TYPE:  INP   LOCATION:  2012                         FACILITY:  [facility]   PHYSICIAN:  Erik Range, MD       DATE OF BIRTH:  [birth year]   DATE OF ADMISSION:  08/01/2008  DATE OF DISCHARGE:  08/02/2008                               DISCHARGE SUMMARY   This patient has no known drug allergies.   FINAL DIAGNOSES:  1. Admitted with palpitation.      a.     Electrocardiogram shows evidence of pre-excitation.      b.     Marked caffeine intake in the hours preceding palpitation       symptoms.  2. Discharging the day of electrophysiology study with radiofrequency      catheter ablation of a right paraseptal accessory pathway at 4      o'clock on the tricuspid valve annulus.      a.     The patient had atrial fibrillation exhibited during the       radiofrequency applications and required direct current       cardioversion.  3. A 2D echocardiogram on August 01, 2008.      a.     Ejection fraction 40-45%.      b.     Mild stenosis of the pulmonic valve.      c.     Distal right ventricular outflow tract appears narrowed.       There was a mildly elevated gradient across the pulmonic valve.   SECONDARY DIAGNOSES:  1. History of pulmonic valve stenosis/pulmonic valve regurgitation.  2. Echocardiogram in March 2005, demonstrates ejection fraction of 55-      65%.   PROCEDURES:  1. A 2D echocardiogram on August 01, 2008.  Once again ejection fraction      of 40-45% with mild stenosis across the pulmonic valve.  2. On August 02, 2008, electrophysiology study with radiofrequency      catheter ablation of a right heart paraseptal accessory pathway,      Dr. Hillis Bryan.   BRIEF HISTORY:  Mr. Erik Bryan is a 25 year old male.  His only known  medical history is moderate pulmonic valve stenosis, moderate pulmonic  valve regurgitation as well as tricuspid valve regurgitation.  He is  followed  by Dr. Dietrich Bryan.  The patient is currently without any  significant symptoms.  However on the evening of July 31, 2008, the  patient had been helping a friend move.  This was accompanied by copious  amounts of caffeinated beverages.  The patient reports having 5-6  minutes of irregular palpitations, which caused shortness of breath.  He  then had a mild ache in the left substernal chest area.  Symptoms  resolved at that time, but they recurred about midnight with a similar  presentation.  The patient reports having similar symptoms in the past,  but they are much less severe.  The patient presents to the emergency  room at [facility] COURSE:  The patient presents with  two separate episodes of  palpitations and some chest discomfort as well as shortness of breath  during the palpitations.  In both cases, there were self terminated.  Electrocardiogram shows a short PR with slurring of the QRS suggesting  WPW.  The patient also has a 3/6 systolic ejection murmur.  The plan  will be for the patient to have a 2D echocardiogram and  electrophysiology consult.  A 2D echocardiogram was performed on August 01, 2008, that showed a decrease from an echocardiogram done in 2005.  In 2005, ejection fraction 55-65%.  On this admission, ejection fraction  40-45%.  There was mild stenosis in the pulmonic valve.  The patient is  oxygenating well and also showed that the right ventricular outflow  tract appeared narrowed.  He was seen in consultation by Dr. Hillis Bryan.  Dr. Jenel Bryan assessment was that the patient would benefit from  electrophysiology study.  The risks and benefits of that procedure were  described to the patient who wished to proceed.  This was done on August 02, 2008.  He underwent radiofrequency catheter ablation of the right  paraseptal accessory pathway.  He exhibited some atrial fibrillation  during the procedure, which required direct current cardioversion.   There was no return or arrhythmia after ablation.  In the postoperative  period, the patient who did require large amounts of sedation both in  the way of benzodiazepines and narcotic, during the electrophysiology  study complained of awakening with overall myalgias especially at the  right neck.  These were not sufficiently abated with Percocet two  tablets, but he did better with morphine 4 mg IV.  The patient is a  candidate for discharge.  However, on August 02, 2008, he will go home  with a prescription for Ultram 50 mg tablets 2 tablets every 4 hours.  He is also asked to start oral aspirin 81 mg, enteric coated aspirin on  a daily basis for the next 6 weeks.  He follows up at [facility]  with Dr. Dietrich Bryan, on Friday, August 16, 2008, at 4:15.  He sees Dr.  Johney Bryan on Wednesday, September 04, 2008, at 9:45.   Laboratory studies this admission are as follows.  Complete blood count:  White cells 7.4, hemoglobin 14.9, hematocrit 41.6, platelets of 165.  Sodium 140, potassium 3.7, chloride 107, carbonate 28, BUN is 10,  creatinine 1.0, glucose 96.  The BNP is less than 30.  Magnesium was  2.5, troponin I study less than 0.05.  The patient tested positive on a  drug screen for marijuana metabolites.      Erik Bryan, [place]      Erik Range, MD  Electronically Signed    GM/MEDQ  D:  08/02/2008  T:  08/03/2008  Job:  [id number]   cc:   Erik Riffle, MD, [facility]

## 2010-05-22 NOTE — Assessment & Plan Note (Signed)
[name]                            CARDIOLOGY OFFICE NOTE   NAME:Bryan, Erik SZCZEPANIK                     MRN:          [id number]  DATE:03/04/2006                            DOB:          Aug 11, 1985    IDENTIFICATION:  The patient is a 25 year old gentleman who was  previously followed by Lorna Few. It looks he was last seen in  2005. He has a history of pulmonic stenosis that is moderate pulmonic  regurgitation.   Since seen, he has been doing fairly well. He works at [employer].  He does a lot of walking and light lifting. He has no problems with  shortness of breath, dizziness, palpitations, or chest pressure. He does  note, actually, occasional episodes of chest pressure and back pain that  have occurred for a while. They last a couple of seconds and are sharp.  They are not associated with a particular activity.   CURRENT MEDICATIONS:  None.   PAST MEDICAL HISTORY:  As noted above.   SOCIAL HISTORY:  He does not smoke. He does not drink.   REVIEW OF SYSTEMS:  All systems reviewed. Negative to the above problem  except as noted.   FAMILY HISTORY:  Negative for congenital heart disease.   PHYSICAL EXAMINATION:  GENERAL: The patient is in no distress.  VITAL SIGNS: Blood pressure 114/68, pulse 76, weight 174.  HEENT: Normocephalic atraumatic, EO MI, pupils equal, round, and  reactive to light.  LUNGS: Clear.  CARDIAC: Regular rate and rhythm, grade 2-3/6 systolic murmur heard best  at the left upper sternal border. RV is slightly prominent, but chest is  thin.  ABDOMEN: Benign.  EXTREMITIES: Good pulses. Equal onset and no edema.   IMPRESSION:  Pulmonary valve disease. I do not hear the regurgitation on  exam, but again, he will need a follow up echocardiogram to look at his  RV to the PI. We will set tentative follow up for one year. Again, I do  not have any records from [facility]   ADDENDUM   12 lead EKG showed  normal sinus rhythm. 76 beats-per-minute. Right  bundle branch block. Borderline for RVH.     Pricilla Riffle, MD, [facility]  Electronically Signed    PVR/MedQ  DD: 03/04/2006  DT: 03/05/2006  Job #: [id number]

## 2010-12-06 IMAGING — CR DG CHEST 1V PORT
1 series · 1 of 1 positions shown · non-contrast
Comparison: Portable exam 1998 hours compared to 02/21/2003

CLINICAL DATA: Chest pain, shortness of breath, palpitations

PORTABLE CHEST - 1 VIEW

[AP]
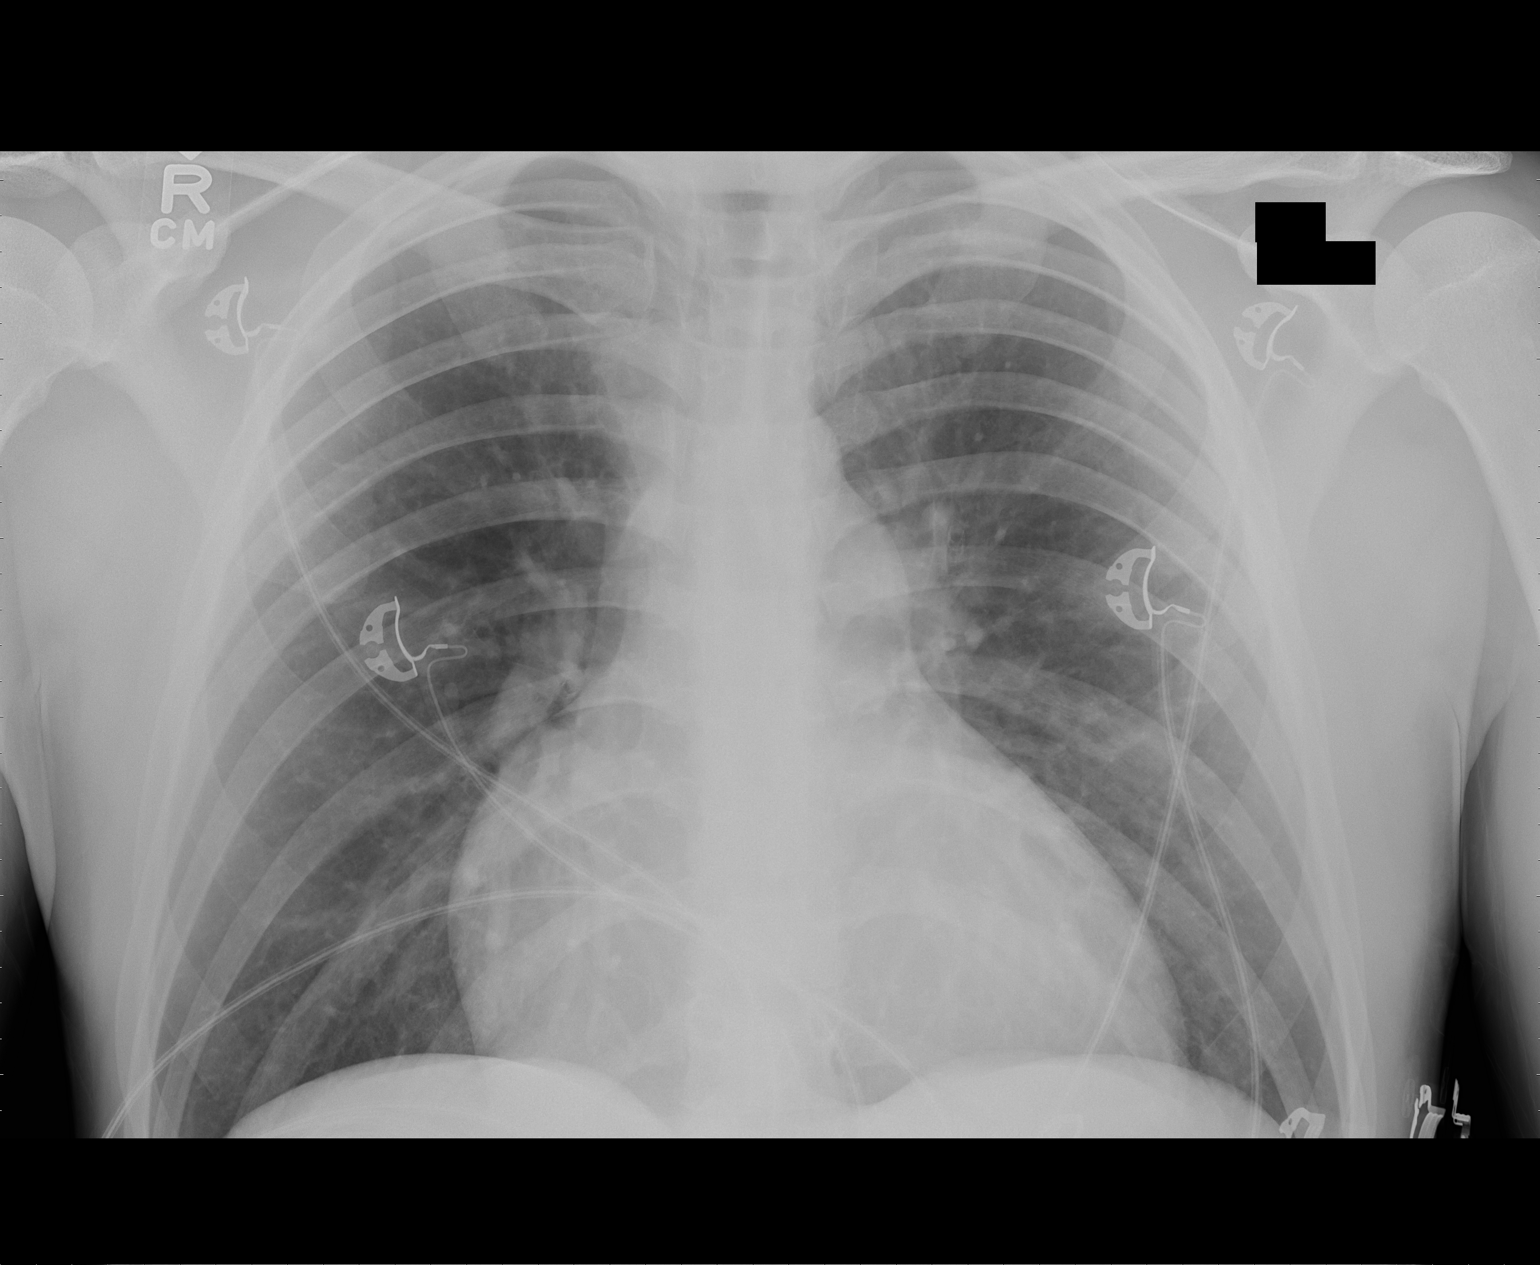

[1 of 1 positions shown; findings below may reference images not displayed]

FINDINGS: Mild cardiac enlargement.
Normal mediastinal contours and pulmonary vascularity.
Lungs clear.
No pleural effusion or pneumothorax.
Cardiac monitoring lines project over chest.
IMPRESSION: Mild cardiac enlargement.

## 2011-01-02 ENCOUNTER — Encounter: Payer: Self-pay | Admitting: *Deleted

## 2011-01-02 ENCOUNTER — Emergency Department (HOSPITAL_COMMUNITY)
Admission: EM | Admit: 2011-01-02 | Discharge: 2011-01-02 | Disposition: A | Discharge status: Home / Self Care | Payer: Medicaid Other | Attending: Emergency Medicine | Admitting: Emergency Medicine

## 2011-01-02 DIAGNOSIS — Z043 Encounter for examination and observation following other accident: Secondary | ICD-10-CM | POA: Insufficient documentation

## 2011-01-02 HISTORY — DX: Unspecified atrial fibrillation: I48.91

## 2011-01-02 HISTORY — DX: Pre-excitation syndrome: I45.6

## 2011-01-02 HISTORY — DX: Cardiac murmur, unspecified: R01.1

## 2011-01-02 MED ORDER — IBUPROFEN 600 MG PO TABS: 600.0000 mg | ORAL_TABLET | Freq: Four times a day (QID) | ORAL | Status: AC | PRN

## 2011-01-02 MED ORDER — DIAZEPAM 5 MG PO TABS: 5.0000 mg | ORAL_TABLET | Freq: Two times a day (BID) | ORAL | Status: AC
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
## 2011-01-03 NOTE — ED Provider Notes (Signed)
Medical screening examination/treatment/procedure(s) were performed by non-physician practitioner and as supervising physician I was immediately available for consultation/collaboration.  Martha K Linker, MD 01/03/11 0711 

## 2011-02-15 IMAGING — CT CT ANGIO CHEST
2 of 6 series · 19 of 36 positions shown · IV contrast (APPLIED)
Comparison: 10/06/2008 chest x-ray

CLINICAL DATA: Chest pain, shortness of breath

CT ANGIOGRAPHY CHEST WITH CONTRAST
TECHNIQUE: Multidetector CT imaging of the chest was performed
using the standard protocol during bolus administration of
intravenous contrast. Multiplanar CT image reconstructions
including MIPs were obtained to evaluate the vascular anatomy.
Contrast: 80 ml Amnipaque-TNN

[Series 6: pe thins @ 1mm · axial · 0.74mm/px · z∈[-330,-34]mm · 18 of 330 slices shown]
[im 17/330  lung]
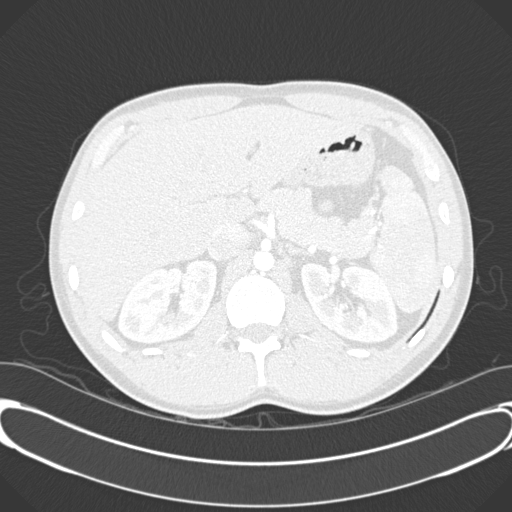
[im 33/330  mediastinal]
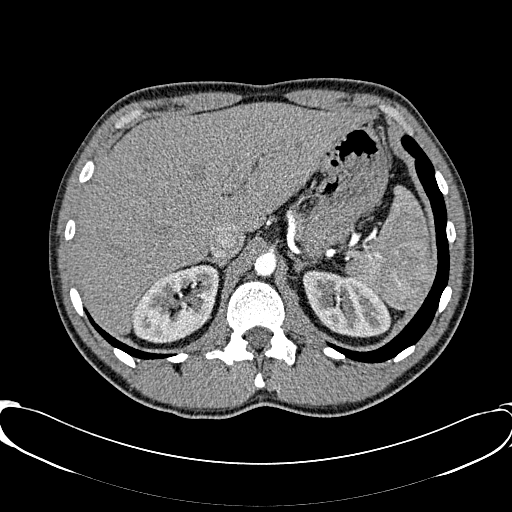
[im 50/330  lung]
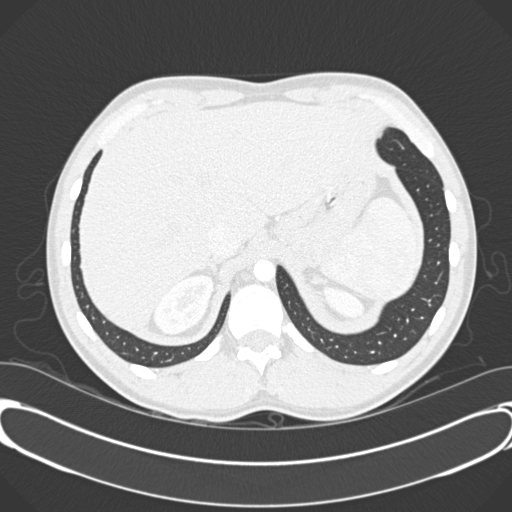
[im 66/330  mediastinal]
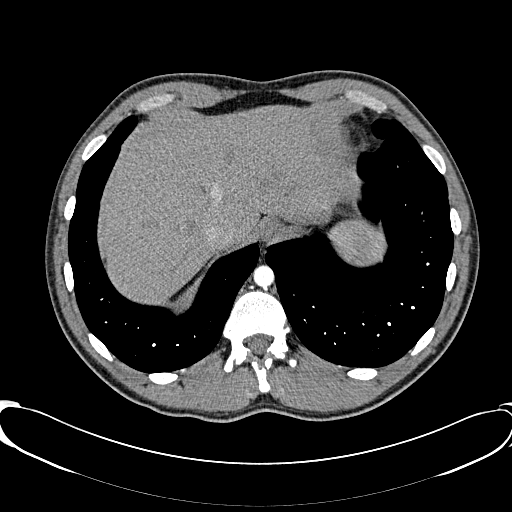
[im 83/330  lung]
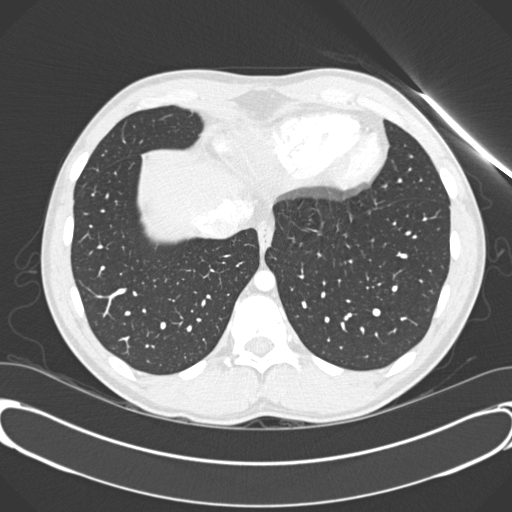
[im 99/330  mediastinal]
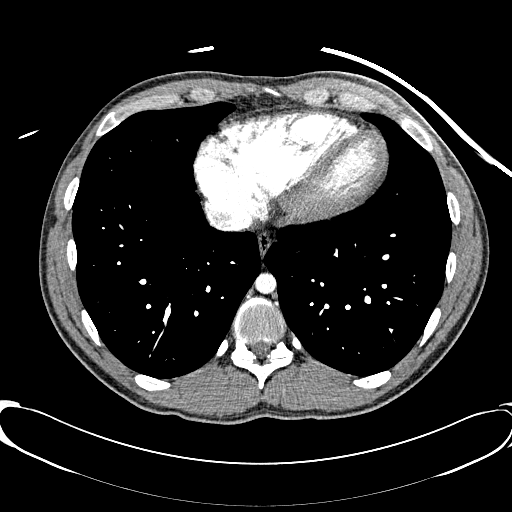
[im 116/330  lung]
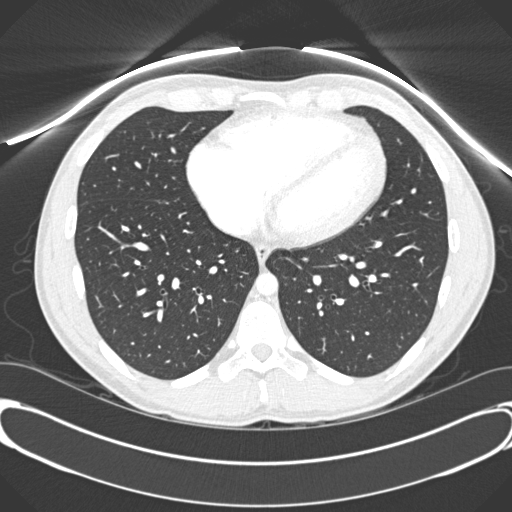
[im 132/330  mediastinal]
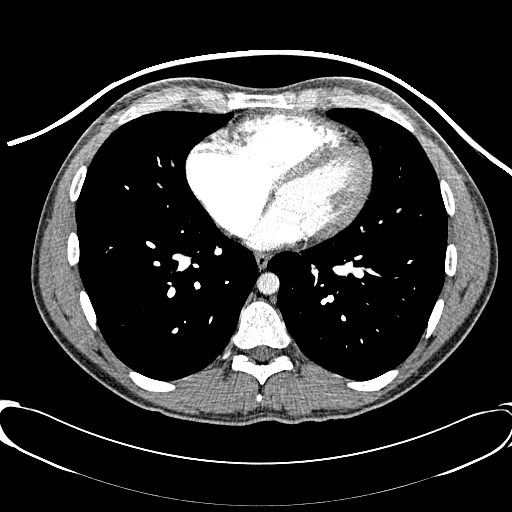
[im 149/330  lung]
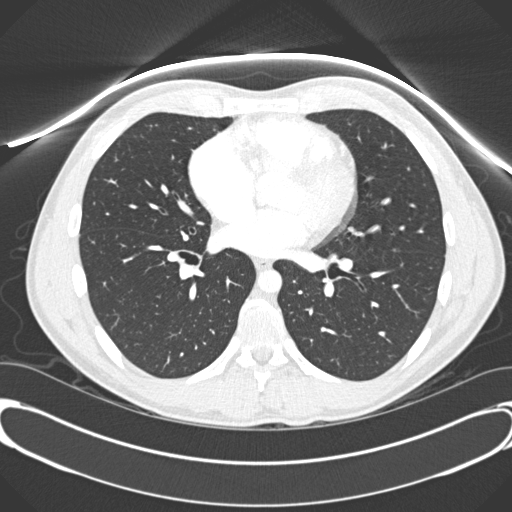
[im 181/330  mediastinal]
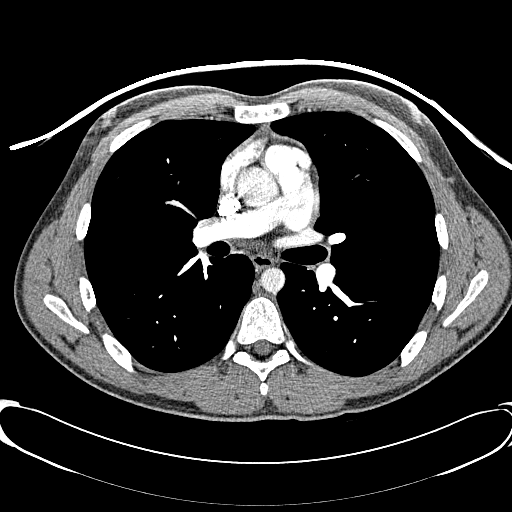
[im 198/330  lung]
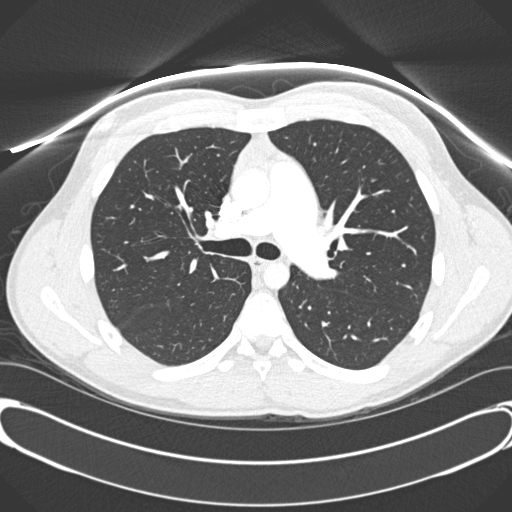
[im 214/330  mediastinal]
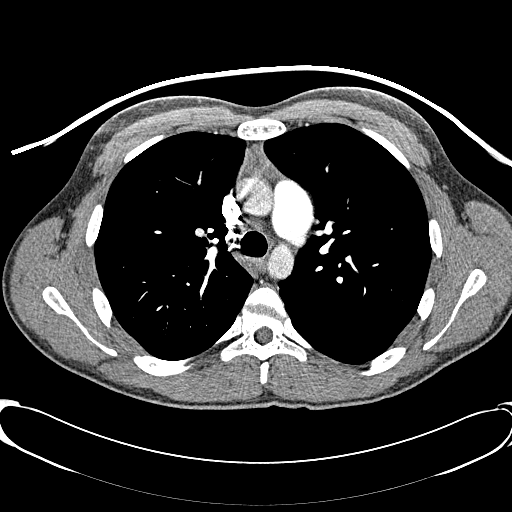
[im 231/330  lung]
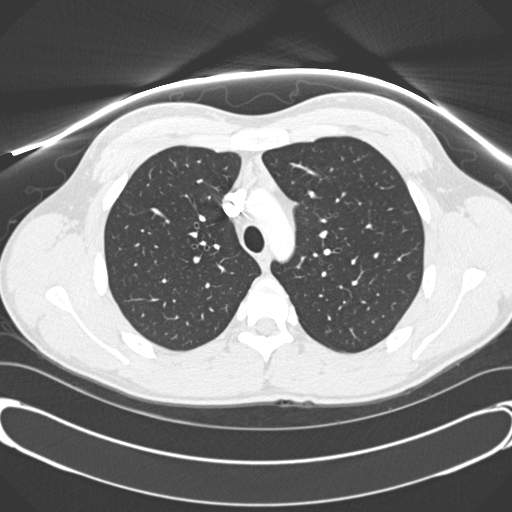
[im 247/330  mediastinal]
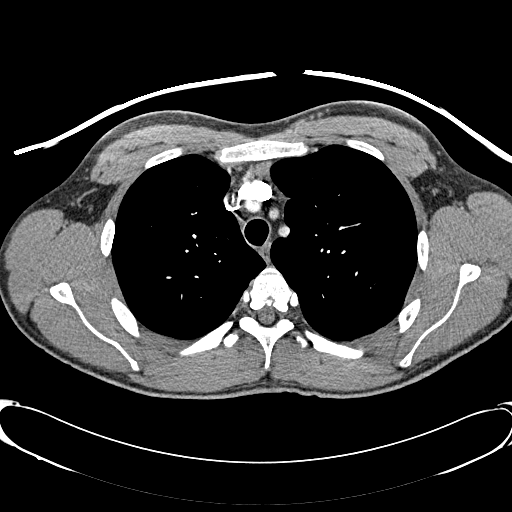
[im 264/330  lung]
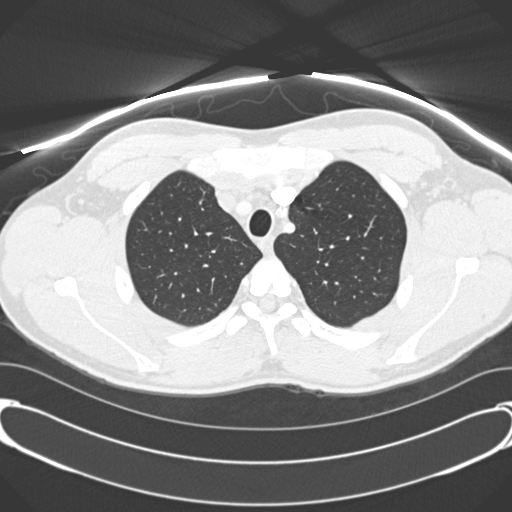
[im 280/330  mediastinal]
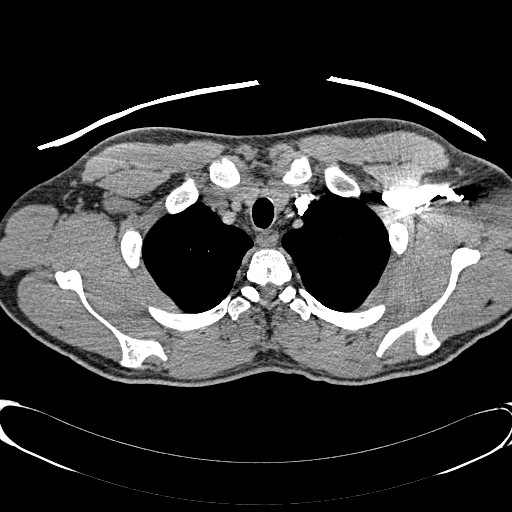
[im 297/330  lung]
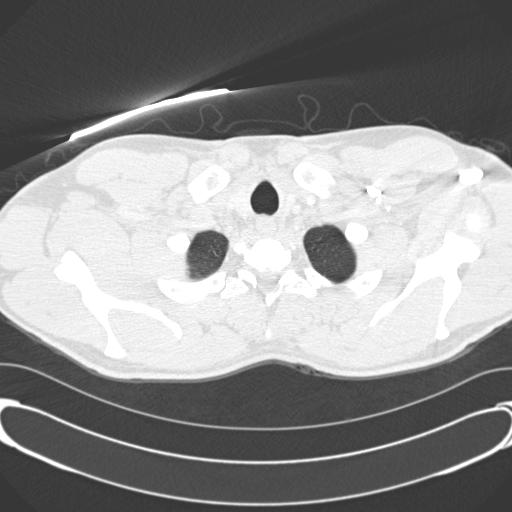
[im 313/330  mediastinal]
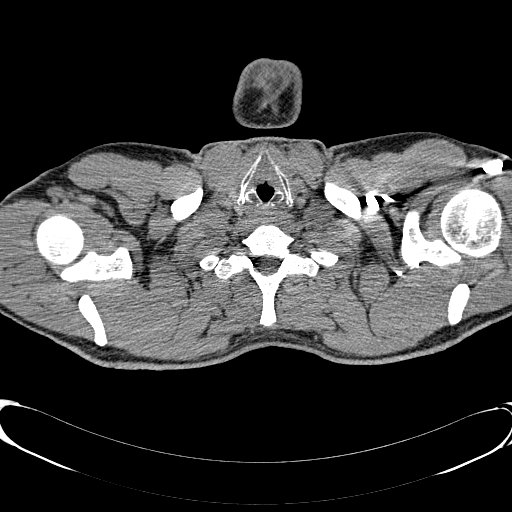

[Series 602: <mpr thick range> · coronal · 0.74mm/px · 1 of 116 slices shown]
[im 58/116  mediastinal]
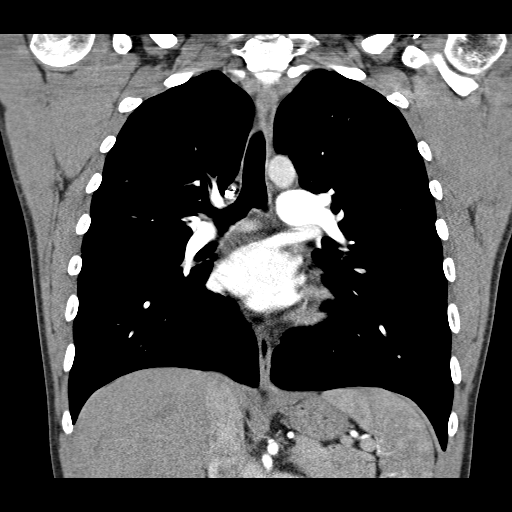

[19 of 36 positions shown; findings below may reference images not displayed]

FINDINGS: Pulmonary arteries are well visualized.  The study is
diagnostic.  No filling defect or acute pulmonary embolus
identified by CTA.  Normal heart size.  No pericardial or pleural
effusion.  No adenopathy.  Triangular soft tissue in the anterior
mediastinum consistent with residual thymic tissue.  No hiatal
hernia.  Imaging of the upper abdomen unremarkable.

Lung windows demonstrate clear lungs.  No focal pneumonia, airspace
disease, edema, interstitial process , or central airway
abnormality.  No pneumothorax or pleural fluid.  No suspicious
pulmonary nodule or mass.

Review of the MIP images confirms the above findings.
IMPRESSION: Negative for acute pulmonary embolus.
No acute intrathoracic process

## 2011-05-13 ENCOUNTER — Emergency Department (HOSPITAL_COMMUNITY)
Admission: EM | Admit: 2011-05-13 | Discharge: 2011-05-13 | Disposition: A | Discharge status: Home / Self Care | Payer: Self-pay | Attending: Emergency Medicine | Admitting: Emergency Medicine

## 2011-05-13 ENCOUNTER — Encounter (HOSPITAL_COMMUNITY): Payer: Self-pay | Admitting: *Deleted

## 2011-05-13 DIAGNOSIS — S30860A Insect bite (nonvenomous) of lower back and pelvis, initial encounter: Secondary | ICD-10-CM | POA: Insufficient documentation

## 2011-05-13 DIAGNOSIS — F17200 Nicotine dependence, unspecified, uncomplicated: Secondary | ICD-10-CM | POA: Insufficient documentation

## 2011-05-13 DIAGNOSIS — I456 Pre-excitation syndrome: Secondary | ICD-10-CM | POA: Insufficient documentation

## 2011-05-13 DIAGNOSIS — R21 Rash and other nonspecific skin eruption: Secondary | ICD-10-CM | POA: Insufficient documentation

## 2011-05-13 DIAGNOSIS — I4891 Unspecified atrial fibrillation: Secondary | ICD-10-CM | POA: Insufficient documentation

## 2011-05-13 DIAGNOSIS — W57XXXA Bitten or stung by nonvenomous insect and other nonvenomous arthropods, initial encounter: Secondary | ICD-10-CM | POA: Insufficient documentation

## 2011-05-13 MED ORDER — AMOXICILLIN 500 MG PO CAPS: 500.0000 mg | ORAL_CAPSULE | Freq: Three times a day (TID) | ORAL | Status: AC

## 2011-05-13 NOTE — ED Provider Notes (Signed)
History     CSN: [id number]  Arrival date & time 05/13/11  1846   First MD Initiated Contact with Patient 05/13/11 2006      Chief Complaint  Patient presents with  . Tick Removal    (Consider location/radiation/quality/duration/timing/severity/associated sxs/prior treatment) HPI Hx from pt. 26yo male who presents with c/o tick bite. [place] and was outside about 5 days ago when he noted a tick biting his R lower abdomen. The tick was not notably engorged, and he was able to remove the entire tick. He does not think that the tick was attached for longer than about 30 minutes. States that since then, he has been feeling "run down, like I have the flu" with myalgias. He has noted two areas of rash which have come up on his abdomen since the bite. Denies fever, chills, chest pain, shortness of breath, nausea/vomiting, abd pain, neck pain.  Past Medical History  Diagnosis Date  . 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 Atrial fibrillation   . Wolf-Parkinson-White syndrome     Past Surgical History  Procedure Date  . Wisdom tooth extraction   . A flutter ablation     No family history on file.  History  Substance Use Topics  . Smoking status: Current Everyday Smoker -- 0.5 packs/day  . Smokeless tobacco: Not on file  . Alcohol Use: Yes     occasionally      Review of Systems  Constitutional: Positive for fatigue. Negative for fever and chills.  HENT: Negative for neck pain and neck stiffness.   Eyes: Negative for photophobia and visual disturbance.  Respiratory: Negative for cough and shortness of breath.   Cardiovascular: Negative for chest pain and palpitations.  Gastrointestinal: Negative for nausea, vomiting, abdominal pain and diarrhea.  Musculoskeletal: Positive for myalgias.  Skin: Positive for rash.  Neurological: Negative for dizziness and weakness.    Allergies  Review of patient's allergies indicates no  known allergies.  Home Medications   Current Outpatient Rx  Name Route Sig Dispense Refill  . ACETAMINOPHEN 325 MG PO TABS Oral Take 650 mg by mouth every 6 (six) hours as needed. Pain    . IBUPROFEN 200 MG PO TABS Oral Take 400 mg by mouth every 6 (six) hours as needed. For pain       BP 120/62  Pulse 95  Temp(Src) 97.7 F (36.5 C) (Oral)  Resp 20  Ht 6' (1.829 m)  Wt 180 lb (81.647 kg)  BMI 24.41 kg/m2  SpO2 100%  Physical Exam  Nursing note and vitals reviewed. Constitutional: He appears well-developed and well-nourished. No distress.  HENT:  Head: Normocephalic and atraumatic.  Mouth/Throat: Oropharynx is clear and moist. No oropharyngeal exudate.  Eyes: EOM are normal. Pupils are equal, round, and reactive to light.  Neck: Normal range of motion. Neck supple.       Full active ROM of neck, no meningeal signs  Cardiovascular: Normal rate and regular rhythm.        Systolic murmur noted (pt has hx of same) No rub noted  Pulmonary/Chest: Effort normal and breath sounds normal. He exhibits no tenderness.  Abdominal: Soft. There is no tenderness. There is no rebound and no guarding.    Musculoskeletal: Normal range of motion.  Lymphadenopathy:    He has no cervical adenopathy.  Neurological: He is alert. No cranial nerve deficit.  Skin: Skin is warm and dry.  He is not diaphoretic.       Skin changes as documented on abd exam  Psychiatric: He has a normal mood and affect. His behavior is normal.    ED Course  Procedures (including critical care time)  Labs Reviewed - No data to display No results found.   1. Tick bite       MDM  Patient presents with myalgias and fatigue since being bitten by a tick on Sunday. He has noticed a rash to his abdomen, which is not directly at the site of the bite. He is afebrile here and nontoxic appearing. He does not appear to have any evidence for complications of Lyme at this time, such as meningitis or pericarditis. However,  the patient does have a history of some type of heart valve problem and arrhythmias. Given this, and the appearance of the rash, feel it would be prudent to treat him as presumed Lyme. Will give him a 3 week course of amoxicillin (he is uninsured so will not rx doxy 2/2 cost). He was given strict return precautions should his symptoms worsen.        Grant Fontana, [place] 05/14/11 [phone]

## 2011-05-13 NOTE — ED Notes (Signed)
Pt states "I removed a tick Sunday or Monday, been feeling like I have the flu; also have some other places I want looked at"

## 2011-05-13 NOTE — Discharge Instructions (Signed)
You may have contracted Lyme disease through your exposure to the tick. We will put you on a course of antibiotics, amoxicillin, to treat this. Please take this as prescribed, until it is gone. Return to the ER if you develop high fever not controlled by medication, neck pain, chest pain, nausea/vomiting, or any other worrisome symptoms.  Lyme Disease You may have been bitten by a tick and are to watch for the development of Lyme Disease. Lyme Disease is an infection that is caused by a bacteria The bacteria causing this disease is named Borreilia burgdorferi. If a tick is infected with this bacteria and then bites you, then Lyme Disease may occur. These ticks are carried by deer and rodents such as rabbits and mice and infest grassy as well as forested areas. Fortunately most tick bites do not cause Lyme Disease.  Lyme Disease is easier to prevent than to treat. First, covering your legs with clothing when walking in areas where ticks are possibly abundant will prevent their attachment because ticks tend to stay within inches of the ground. Second, using insecticides containing DEET can be applied on skin or clothing. Last, because it takes about 12 to 24 hours for the tick to transmit the disease after attachment to the human host, you should inspect your body for ticks twice a day when you are in areas where Lyme Disease is common. You must look thoroughly when searching for ticks. The Ixodes tick that carries Lyme Disease is very small. It is around the size of a sesame seed (picture of tick is not actual size). Removal is best done by grasping the tick by the head and pulling it out. Do not to squeeze the body of the tick. This could inject the infecting bacteria into the bite site. Wash the area of the bite with an antiseptic solution after removal.  Lyme Disease is a disease that may affect many body systems. Because of the small size of the biting tick, most people do not notice being bitten. The first  sign of an infection is usually a round red rash that extends out from the center of the tick bite. The center of the lesion may be blood colored (hemorrhagic) or have tiny blisters (vesicular). Most lesions have bright red outer borders and partial central clearing. This rash may extend out many inches in diameter, and multiple lesions may be present. Other symptoms such as fatigue, headaches, chills and fever, general achiness and swelling of lymph glands may also occur. If this first stage of the disease is left untreated, these symptoms may gradually resolve by themselves, or progressive symptoms may occur because of spread of infection to other areas of the body.  Follow up with your caregiver to have testing and treatment if you have a tick bite and you develop any of the above complaints. Your caregiver may recommend preventative (prophylactic) medications which kill bacteria (antibiotics). Once a diagnosis of Lyme Disease is made, antibiotic treatment is highly likely to cure the disease. Effective treatment of late stage Lyme Disease may require longer courses of antibiotic therapy.  MAKE SURE YOU:   Understand these instructions.   Will watch your condition.   Will get help right away if you are not doing well or get worse.  Document Released: 03/29/2000 Document Revised: 12/10/2010 Document Reviewed: 05/31/2008 [facility] Patient Information 2012 [place], [place].

## 2011-05-16 NOTE — ED Provider Notes (Signed)
Medical screening examination/treatment/procedure(s) were performed by non-physician practitioner and as supervising physician I was immediately available for consultation/collaboration.  Cordaro Mukai, MD 05/16/11 1107 

## 2011-07-23 ENCOUNTER — Emergency Department (HOSPITAL_COMMUNITY): Payer: Self-pay

## 2011-07-23 ENCOUNTER — Emergency Department (HOSPITAL_COMMUNITY)
Admission: EM | Admit: 2011-07-23 | Discharge: 2011-07-23 | Disposition: A | Discharge status: Home / Self Care | Payer: Self-pay | Attending: Emergency Medicine | Admitting: Emergency Medicine

## 2011-07-23 ENCOUNTER — Encounter (HOSPITAL_COMMUNITY): Payer: Self-pay | Admitting: *Deleted

## 2011-07-23 DIAGNOSIS — I4891 Unspecified atrial fibrillation: Secondary | ICD-10-CM | POA: Insufficient documentation

## 2011-07-23 DIAGNOSIS — I456 Pre-excitation syndrome: Secondary | ICD-10-CM | POA: Insufficient documentation

## 2011-07-23 DIAGNOSIS — Y93K1 Activity, walking an animal: Secondary | ICD-10-CM | POA: Insufficient documentation

## 2011-07-23 DIAGNOSIS — S3992XA Unspecified injury of lower back, initial encounter: Secondary | ICD-10-CM

## 2011-07-23 DIAGNOSIS — IMO0002 Reserved for concepts with insufficient information to code with codable children: Secondary | ICD-10-CM | POA: Insufficient documentation

## 2011-07-23 DIAGNOSIS — Y998 Other external cause status: Secondary | ICD-10-CM | POA: Insufficient documentation

## 2011-07-23 DIAGNOSIS — F17200 Nicotine dependence, unspecified, uncomplicated: Secondary | ICD-10-CM | POA: Insufficient documentation

## 2011-07-23 DIAGNOSIS — W010XXA Fall on same level from slipping, tripping and stumbling without subsequent striking against object, initial encounter: Secondary | ICD-10-CM | POA: Insufficient documentation

## 2011-07-23 MED ORDER — IBUPROFEN 800 MG PO TABS
800.0000 mg | ORAL_TABLET | Freq: Once | ORAL | Status: AC
  Administered 2011-07-23: 800 mg via ORAL
  Filled 2011-07-23: qty 1

## 2011-07-23 MED ORDER — HYDROCODONE-ACETAMINOPHEN 5-500 MG PO TABS: 1.0000 | ORAL_TABLET | Freq: Four times a day (QID) | ORAL | Status: AC | PRN

## 2011-07-23 MED ORDER — CYCLOBENZAPRINE HCL 5 MG PO TABS: 5.0000 mg | ORAL_TABLET | Freq: Two times a day (BID) | ORAL | Status: AC | PRN

## 2011-07-23 NOTE — ED Notes (Signed)
Pt states he was walking his dog up a steep hill last night and slipped on wet grass and fell backwards. Pt denies LOC, reports hit head and now c/o back pain.

## 2011-07-23 NOTE — ED Provider Notes (Signed)
History     CSN: [id number]  Arrival date & time 07/23/11  1142   First MD Initiated Contact with Patient 07/23/11 1159      Chief Complaint  Patient presents with  . Fall  . Back Pain    (Consider location/radiation/quality/duration/timing/severity/associated sxs/prior treatment) HPI  Patient presents to the emergency department for evaluation after a fall last night. He states that he was walking up the hill with his dog when he slipped on some wet grass and fell backwards injuring his thoracic spine. He denies loss of consciousness denies any his head but he states that his back is really sore. He is able to walk without any difficulties and denies having any neurological symptoms. He denies any numbness or tingling in his extremities and he denies bowel or urinary incontinence.  Past Medical History  Diagnosis Date  . 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 Atrial fibrillation   . Wolf-Parkinson-White syndrome     Past Surgical History  Procedure Date  . Wisdom tooth extraction   . A flutter ablation     History reviewed. No pertinent family history.  History  Substance Use Topics  . Smoking status: Current Everyday Smoker -- 0.5 packs/day  . Smokeless tobacco: Not on file  . Alcohol Use: Yes     occasionally      Review of Systems   HEENT: denies blurry vision or change in hearing PULMONARY: Denies difficulty breathing and SOB CARDIAC: denies chest pain or heart palpitations MUSCULOSKELETAL:  denies being unable to ambulate ABDOMEN AL: denies abdominal pain GU: denies loss of bowel or urinary control NEURO: denies numbness and tingling in extremities SKIN: no new rashes PSYCH: patient denies anxiety or depression. NECK: Pt denies having neck pain     Allergies  Review of patient's allergies indicates no known allergies.  Home Medications   Current Outpatient Rx  Name Route Sig Dispense Refill  . ACETAMINOPHEN 325  MG PO TABS Oral Take 650 mg by mouth every 6 (six) hours as needed. Pain    . IBUPROFEN 200 MG PO TABS Oral Take 400 mg by mouth every 6 (six) hours as needed. For pain     . CYCLOBENZAPRINE HCL 5 MG PO TABS Oral Take 1 tablet (5 mg total) by mouth 2 (two) times daily as needed for muscle spasms. 20 tablet 0  . HYDROCODONE-ACETAMINOPHEN 5-500 MG PO TABS Oral Take 1 tablet by mouth every 6 (six) hours as needed for pain. 15 tablet 0    BP 129/71  Pulse 79  Temp 98.5 F (36.9 C) (Oral)  Resp 16  Ht 5\' 10"  (1.778 m)  Wt 175 lb (79.379 kg)  BMI 25.11 kg/m2  SpO2 100%  Physical Exam  Nursing note and vitals reviewed. Constitutional: He appears well-developed and well-nourished. No distress.  HENT:  Head: Normocephalic and atraumatic.  Eyes: Pupils are equal, round, and reactive to light.  Neck: Normal range of motion. Neck supple.  Cardiovascular: Normal rate and regular rhythm.   Pulmonary/Chest: Effort normal.  Abdominal: Soft.  Musculoskeletal:       Thoracic back: He exhibits tenderness. He exhibits normal range of motion.       Back:        Equal strength to bilateral lower extremities. Neurosensory function adequate to both legs. Skin color is normal. Skin is warm and moist. I see no step off deformity, no bony tenderness. Pt is able to ambulate without  limp. Pain is relieved when sitting in certain positions. ROM is decreased due to pain. No crepitus, laceration, effusion, swelling.  Pulses are normal   Neurological: He is alert.  Skin: Skin is warm and dry.    ED Course  Procedures (including critical care time)  Labs Reviewed - No data to display Dg Thoracic Spine 2 View  07/23/2011  *RADIOLOGY REPORT*  Clinical Data: Larey Seat.  Pain.  THORACIC SPINE - 2 VIEW  Comparison: 02/14/2010  Findings: Alignment is normal.  No fracture.  No paravertebral swelling.  Posteromedial ribs appear normal.  IMPRESSION: Negative radiographs  Original Report Authenticated By: Thomasenia Sales,  M.D.     1. Back injury       MDM  Patient with back pain. No neurological deficits. Patient is ambulatory. No warning symptoms of back pain including: loss of bowel or bladder control, night sweats, waking from sleep with back pain, unexplained fevers or weight loss, h/o cancer, IVDU, recent trauma. No concern for cauda equina, epidural abscess, or other serious cause of back pain. Conservative measures such as rest, ice/heat and pain medicine indicated with PCP follow-up if no improvement with conservative management.           Dorthula Matas, PA 07/23/11 1325

## 2011-07-24 NOTE — ED Provider Notes (Signed)
Medical screening examination/treatment/procedure(s) were performed by non-physician practitioner and as supervising physician I was immediately available for consultation/collaboration.   Suzi Roots, MD 07/24/11 [phone]

## 2011-09-07 ENCOUNTER — Emergency Department (HOSPITAL_COMMUNITY): Payer: Self-pay

## 2011-09-07 ENCOUNTER — Encounter (HOSPITAL_COMMUNITY): Payer: Self-pay | Admitting: Emergency Medicine

## 2011-09-07 ENCOUNTER — Emergency Department (HOSPITAL_COMMUNITY)
Admission: EM | Admit: 2011-09-07 | Discharge: 2011-09-07 | Disposition: A | Discharge status: Home / Self Care | Payer: Self-pay | Attending: Emergency Medicine | Admitting: Emergency Medicine

## 2011-09-07 DIAGNOSIS — F17200 Nicotine dependence, unspecified, uncomplicated: Secondary | ICD-10-CM | POA: Insufficient documentation

## 2011-09-07 DIAGNOSIS — S6990XA Unspecified injury of unspecified wrist, hand and finger(s), initial encounter: Secondary | ICD-10-CM | POA: Insufficient documentation

## 2011-09-07 DIAGNOSIS — Y92009 Unspecified place in unspecified non-institutional (private) residence as the place of occurrence of the external cause: Secondary | ICD-10-CM | POA: Insufficient documentation

## 2011-09-07 DIAGNOSIS — I456 Pre-excitation syndrome: Secondary | ICD-10-CM | POA: Insufficient documentation

## 2011-09-07 DIAGNOSIS — W208XXA Other cause of strike by thrown, projected or falling object, initial encounter: Secondary | ICD-10-CM | POA: Insufficient documentation

## 2011-09-07 DIAGNOSIS — I4891 Unspecified atrial fibrillation: Secondary | ICD-10-CM | POA: Insufficient documentation

## 2011-09-07 MED ORDER — IBUPROFEN 600 MG PO TABS: 600.0000 mg | ORAL_TABLET | Freq: Four times a day (QID) | ORAL | Status: AC | PRN

## 2011-09-07 MED ORDER — OXYCODONE-ACETAMINOPHEN 5-325 MG PO TABS
1.0000 | ORAL_TABLET | Freq: Once | ORAL | Status: AC
  Administered 2011-09-07: 1 via ORAL
  Filled 2011-09-07: qty 1

## 2011-09-07 MED ORDER — OXYCODONE-ACETAMINOPHEN 5-325 MG PO TABS: 2.0000 | ORAL_TABLET | ORAL | Status: AC | PRN

## 2011-09-07 MED ORDER — IBUPROFEN 800 MG PO TABS
800.0000 mg | ORAL_TABLET | Freq: Once | ORAL | Status: AC
Start: 2011-09-07 — End: 2011-09-07
  Administered 2011-09-07: 800 mg via ORAL
  Filled 2011-09-07: qty 1

## 2011-09-07 NOTE — ED Notes (Signed)
Pt was stacking firewood when a log fell on his right hand.  Area is reddened and painful.  Slight swelling to the area.

## 2011-09-07 NOTE — ED Provider Notes (Signed)
History     CSN: [id number]  Arrival date & time 09/07/11  1517   First MD Initiated Contact with Patient 09/07/11 1727      Chief Complaint  Patient presents with  . Hand Injury    (Consider location/radiation/quality/duration/timing/severity/associated sxs/prior treatment) Patient is a 26 y.o. male presenting with hand injury. The history is provided by the patient. No language interpreter was used.  Hand Injury  The incident occurred 3 to 5 hours ago. The incident occurred at home. The injury mechanism was a direct blow. The pain is present in the right hand. The quality of the pain is described as aching. The pain is at a severity of 5/10. The pain is mild. The pain has been constant since the incident. Pertinent negatives include no fever. He reports no foreign bodies present. He has tried NSAIDs for the symptoms. The treatment provided mild relief.  Stack of wood fell on R hand patient is R handed.  Slightly swollen.  Good sensation and movement to fingers.  Past Medical History  Diagnosis Date  . 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 Atrial fibrillation   . Wolf-Parkinson-White syndrome     Past Surgical History  Procedure Date  . Wisdom tooth extraction   . A flutter ablation     History reviewed. No pertinent family history.  History  Substance Use Topics  . Smoking status: Current Everyday Smoker -- 0.5 packs/day  . Smokeless tobacco: Not on file  . Alcohol Use: Yes     occasionally      Review of Systems  Constitutional: Negative.  Negative for fever.  HENT: Negative.   Eyes: Negative.   Respiratory: Negative.   Cardiovascular: Negative.   Gastrointestinal: Negative.   Musculoskeletal:       R hand pain swelling  Neurological: Negative.   Psychiatric/Behavioral: Negative.   All other systems reviewed and are negative.    Allergies  Review of patient's allergies indicates no known allergies.  Home Medications    Current Outpatient Rx  Name Route Sig Dispense Refill  . IBUPROFEN 200 MG PO TABS Oral Take 400 mg by mouth every 6 (six) hours as needed. For pain       BP 120/64  Pulse 90  Temp 98 F (36.7 C)  Resp 16  Wt 180 lb (81.647 kg)  SpO2 99%  Physical Exam  Nursing note and vitals reviewed. Constitutional: He is oriented to person, place, and time. He appears well-developed and well-nourished.  HENT:  Head: Normocephalic.  Eyes: Conjunctivae and EOM are normal. Pupils are equal, round, and reactive to light.  Neck: Normal range of motion. Neck supple.  Cardiovascular: Normal rate.   Pulmonary/Chest: Effort normal.  Abdominal: Soft.  Musculoskeletal: Normal range of motion. He exhibits edema and tenderness.       R hand tenderness slightly swollen 2+ radial pulse good sensation and movement  Neurological: He is alert and oriented to person, place, and time.  Skin: Skin is warm and dry.  Psychiatric: He has a normal mood and affect.    ED Course  Procedures (including critical care time)  Labs Reviewed - No data to display Dg Hand Complete Right  09/07/2011  *RADIOLOGY REPORT*  Clinical Data: Right fourth MCP joint pain.  Hand trauma.  RIGHT HAND - COMPLETE 3+ VIEW  Comparison: None.  Findings: No radiopaque foreign body is identified.  Alignment of the bones of the hand is anatomic.  Bandages present on the long finger.  Healed boxers fracture of the fifth metacarpal. Metacarpal bases appear within normal limits.  IMPRESSION: No acute osseous abnormality.   Original Report Authenticated By: Andreas Newport, M.D.      No diagnosis found.    MDM  Which fell on his right hand today with an injury. X-rays negative for fracture. We will put a wrist splint on this hand to remind him not to use it until it feels better. Ibuprofen and Percocet as needed for pain ice and elevate tonight. Followup with or so if not better in 7-10 days.        Remi Haggard, NP 09/07/11 1753

## 2011-09-09 NOTE — ED Provider Notes (Signed)
Medical screening examination/treatment/procedure(s) were performed by non-physician practitioner and as supervising physician I was immediately available for consultation/collaboration.  Raeford Razor, MD 09/09/11 [phone]

## 2012-01-11 IMAGING — DX DG ANKLE COMPLETE 3+V*L*
1 series · 3 of 3 positions shown · non-contrast
Comparison: Plain films left ankle [DATE] 11[birth year]19/1935.

CLINICAL DATA: Post reduction films after fracture dislocation.

LEFT ANKLE COMPLETE - 3+ VIEW

[Series 1: series 1 · left · 3 of 3 slices shown]
[im 1/3]
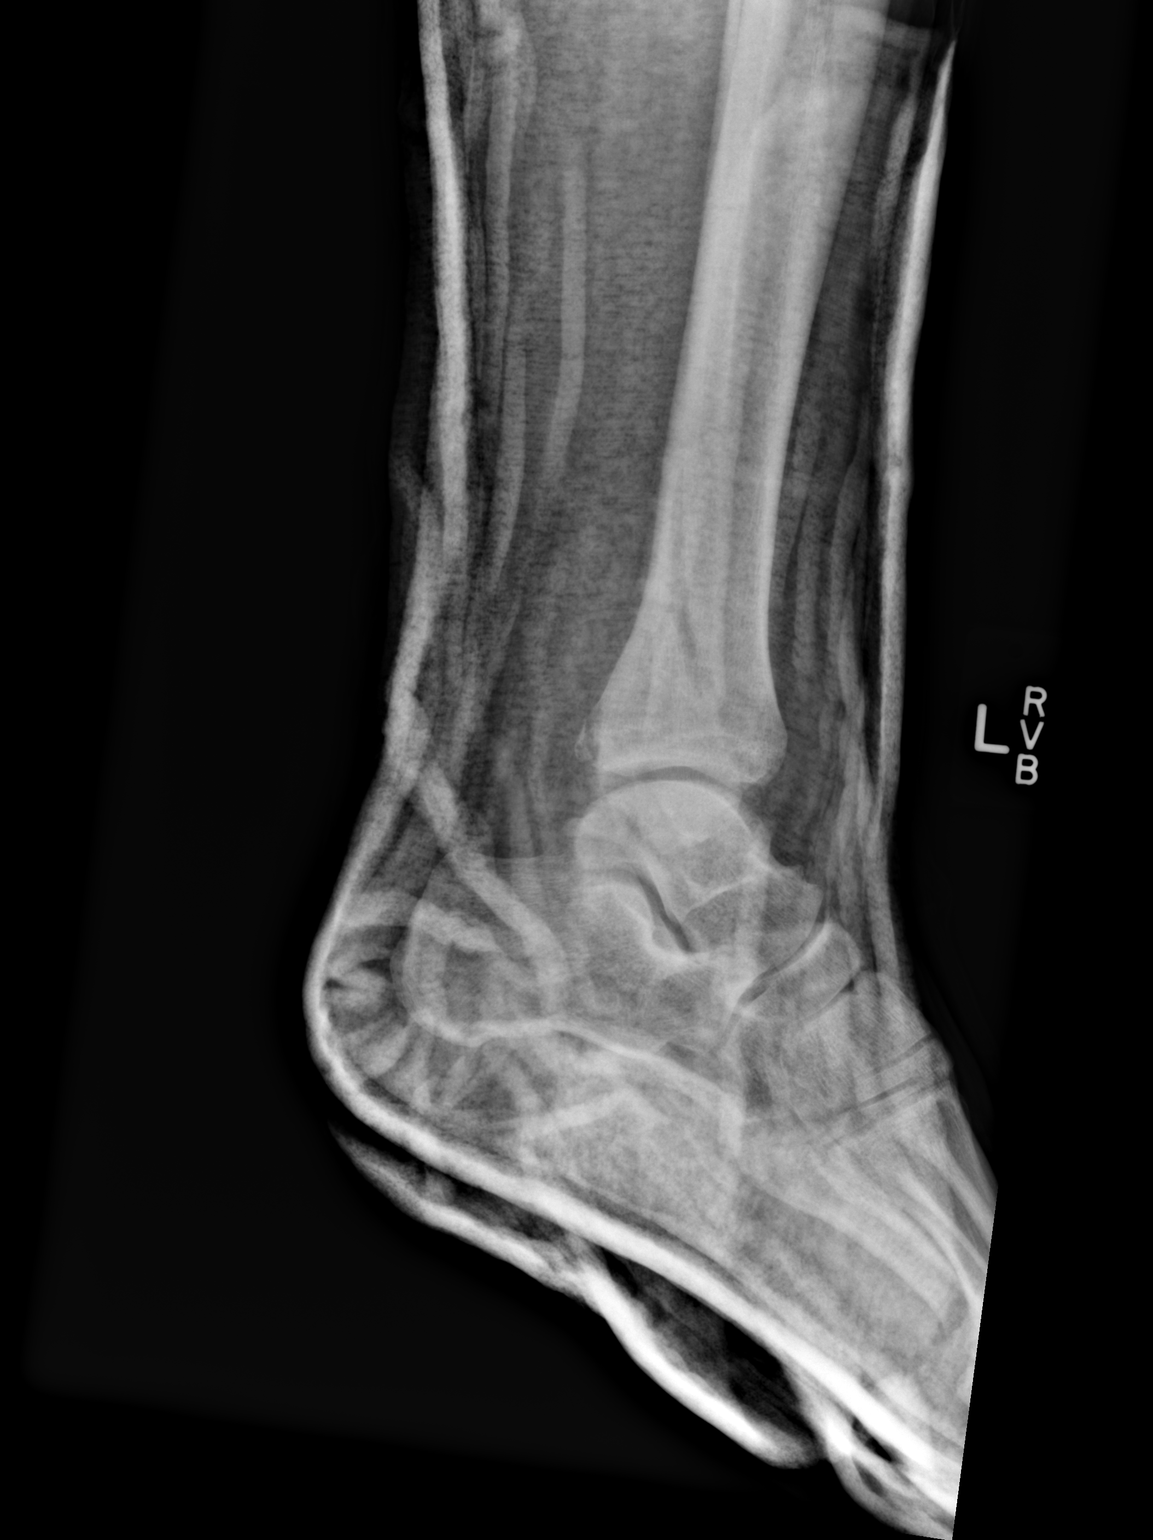
[im 2/3]
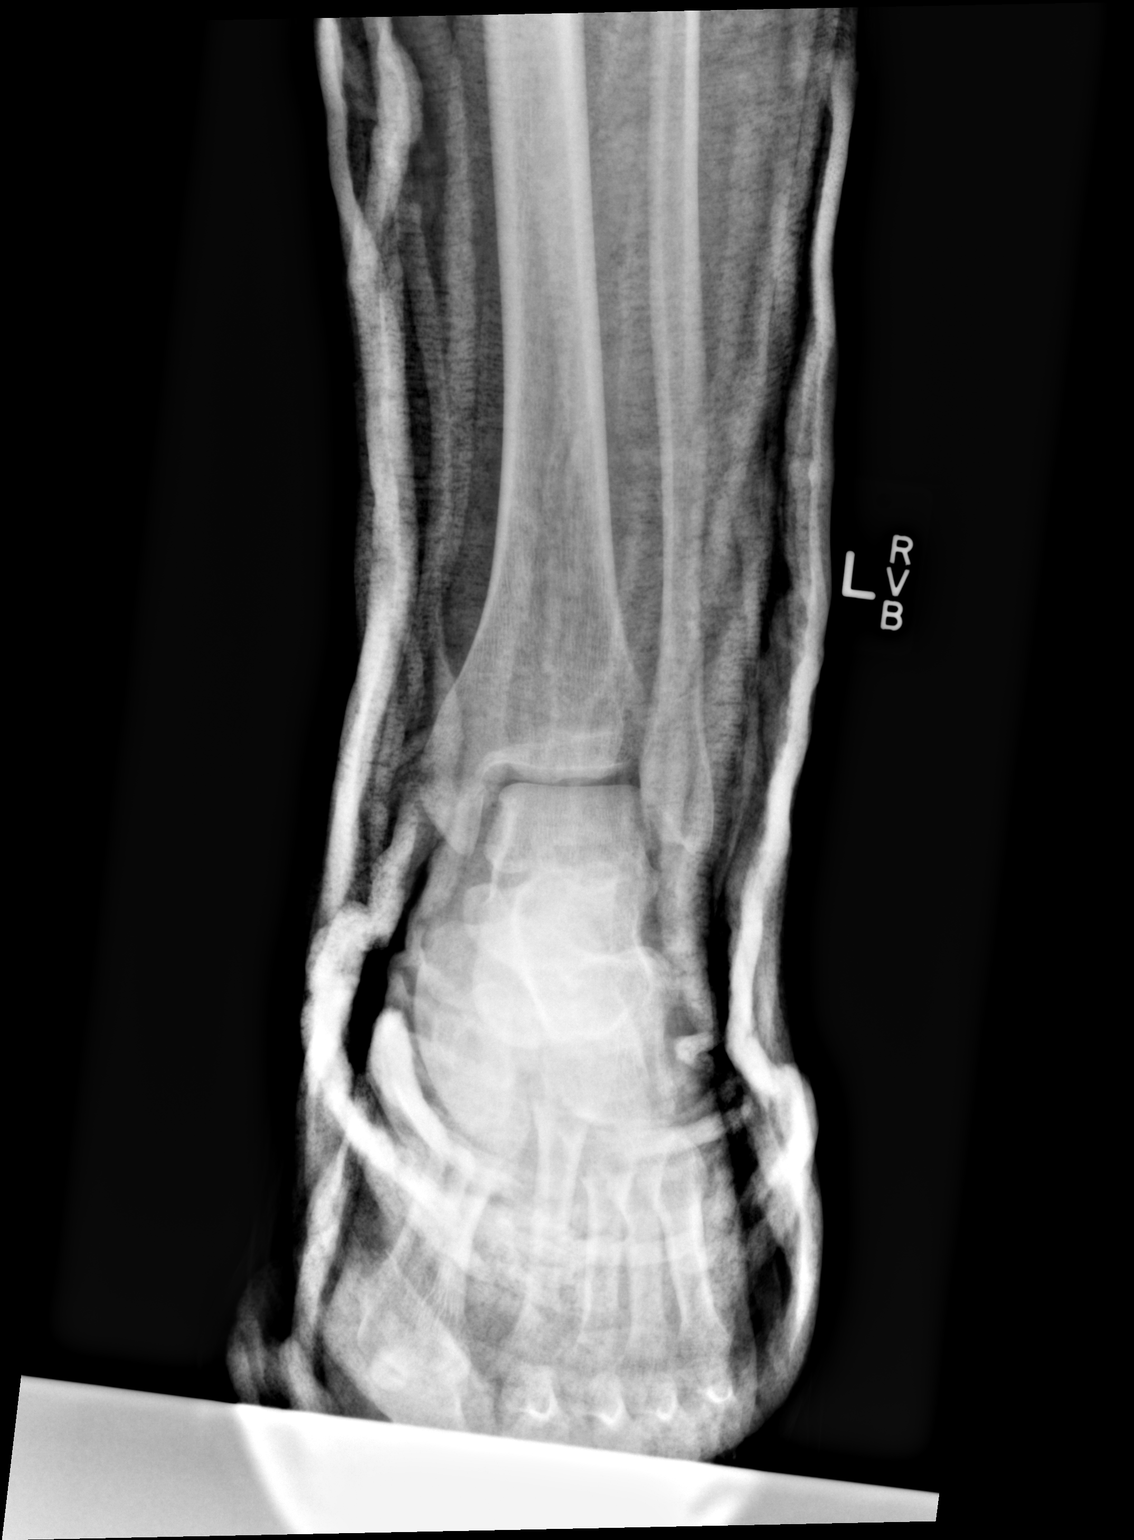
[im 3/3]
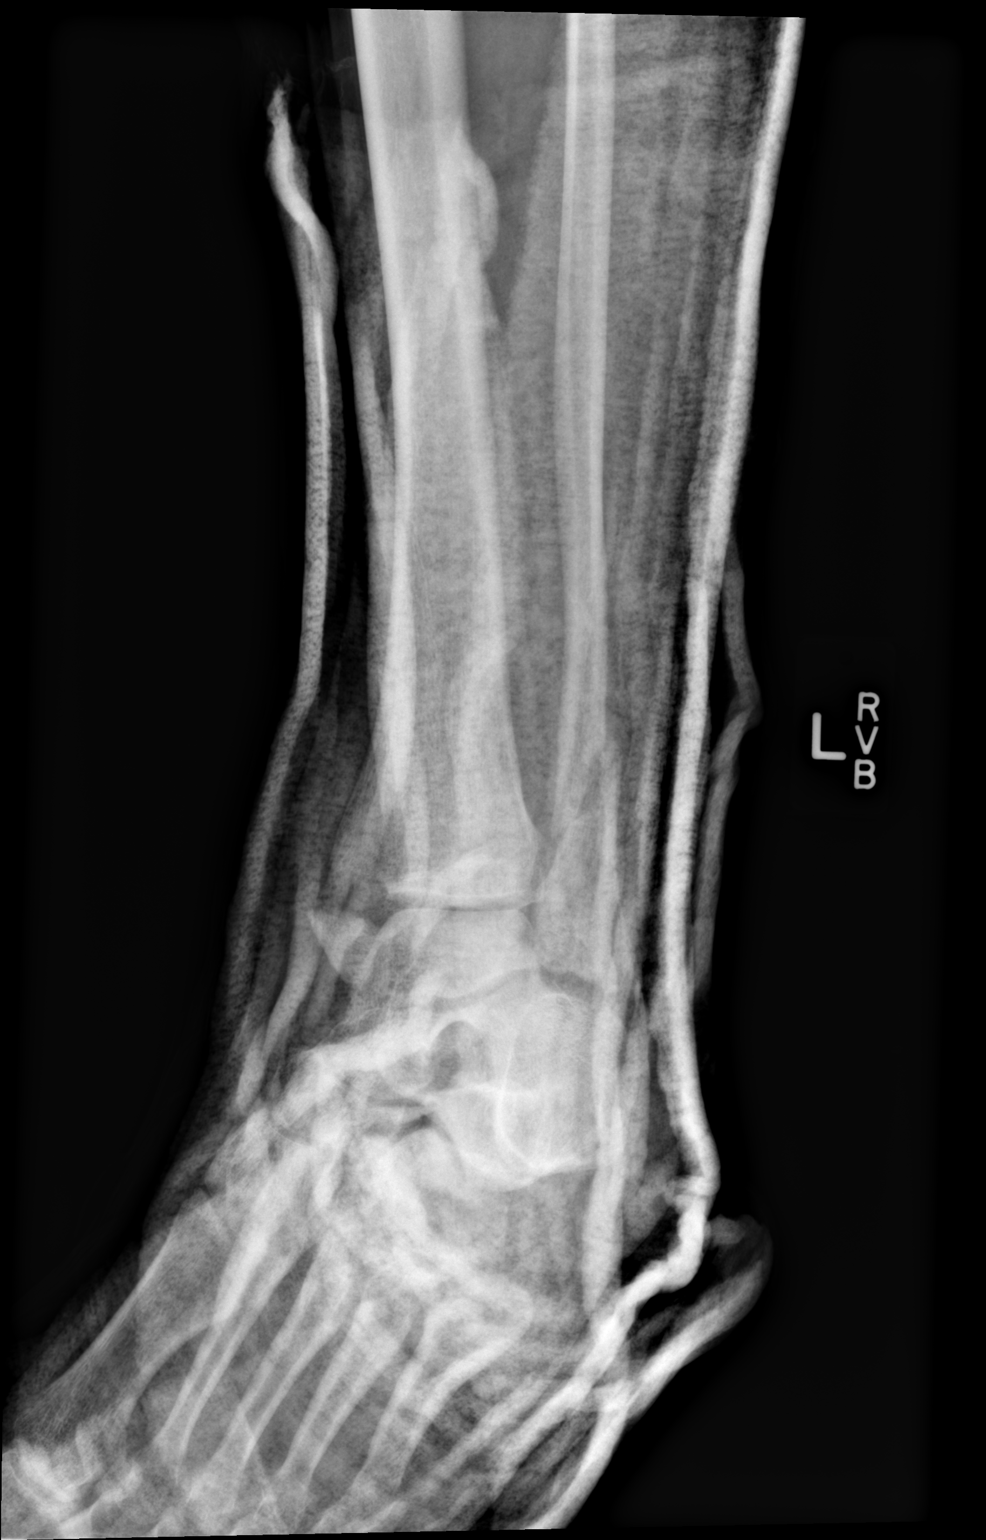

[3 of 3 positions shown; findings below may reference images not displayed]

FINDINGS: The patient is in a plaster cast.  Posterior dislocation
of the tibiotalar joint has been reduced.  Position and alignment
of trimalleolar fractures is markedly improved.
IMPRESSION: Reduction of the tibiotalar joint with marked improvement in
position and alignment of trimalleolar fractures.  No new
abnormality.

## 2012-01-11 IMAGING — CR DG ANKLE COMPLETE 3+V*L*
2 series · 2 of 2 positions shown · non-contrast
Comparison: None

CLINICAL DATA: A skateboard injury.  Left ankle deformity and pain.

LEFT ANKLE COMPLETE - 3+ VIEW

[view not recorded (1 of 2)]
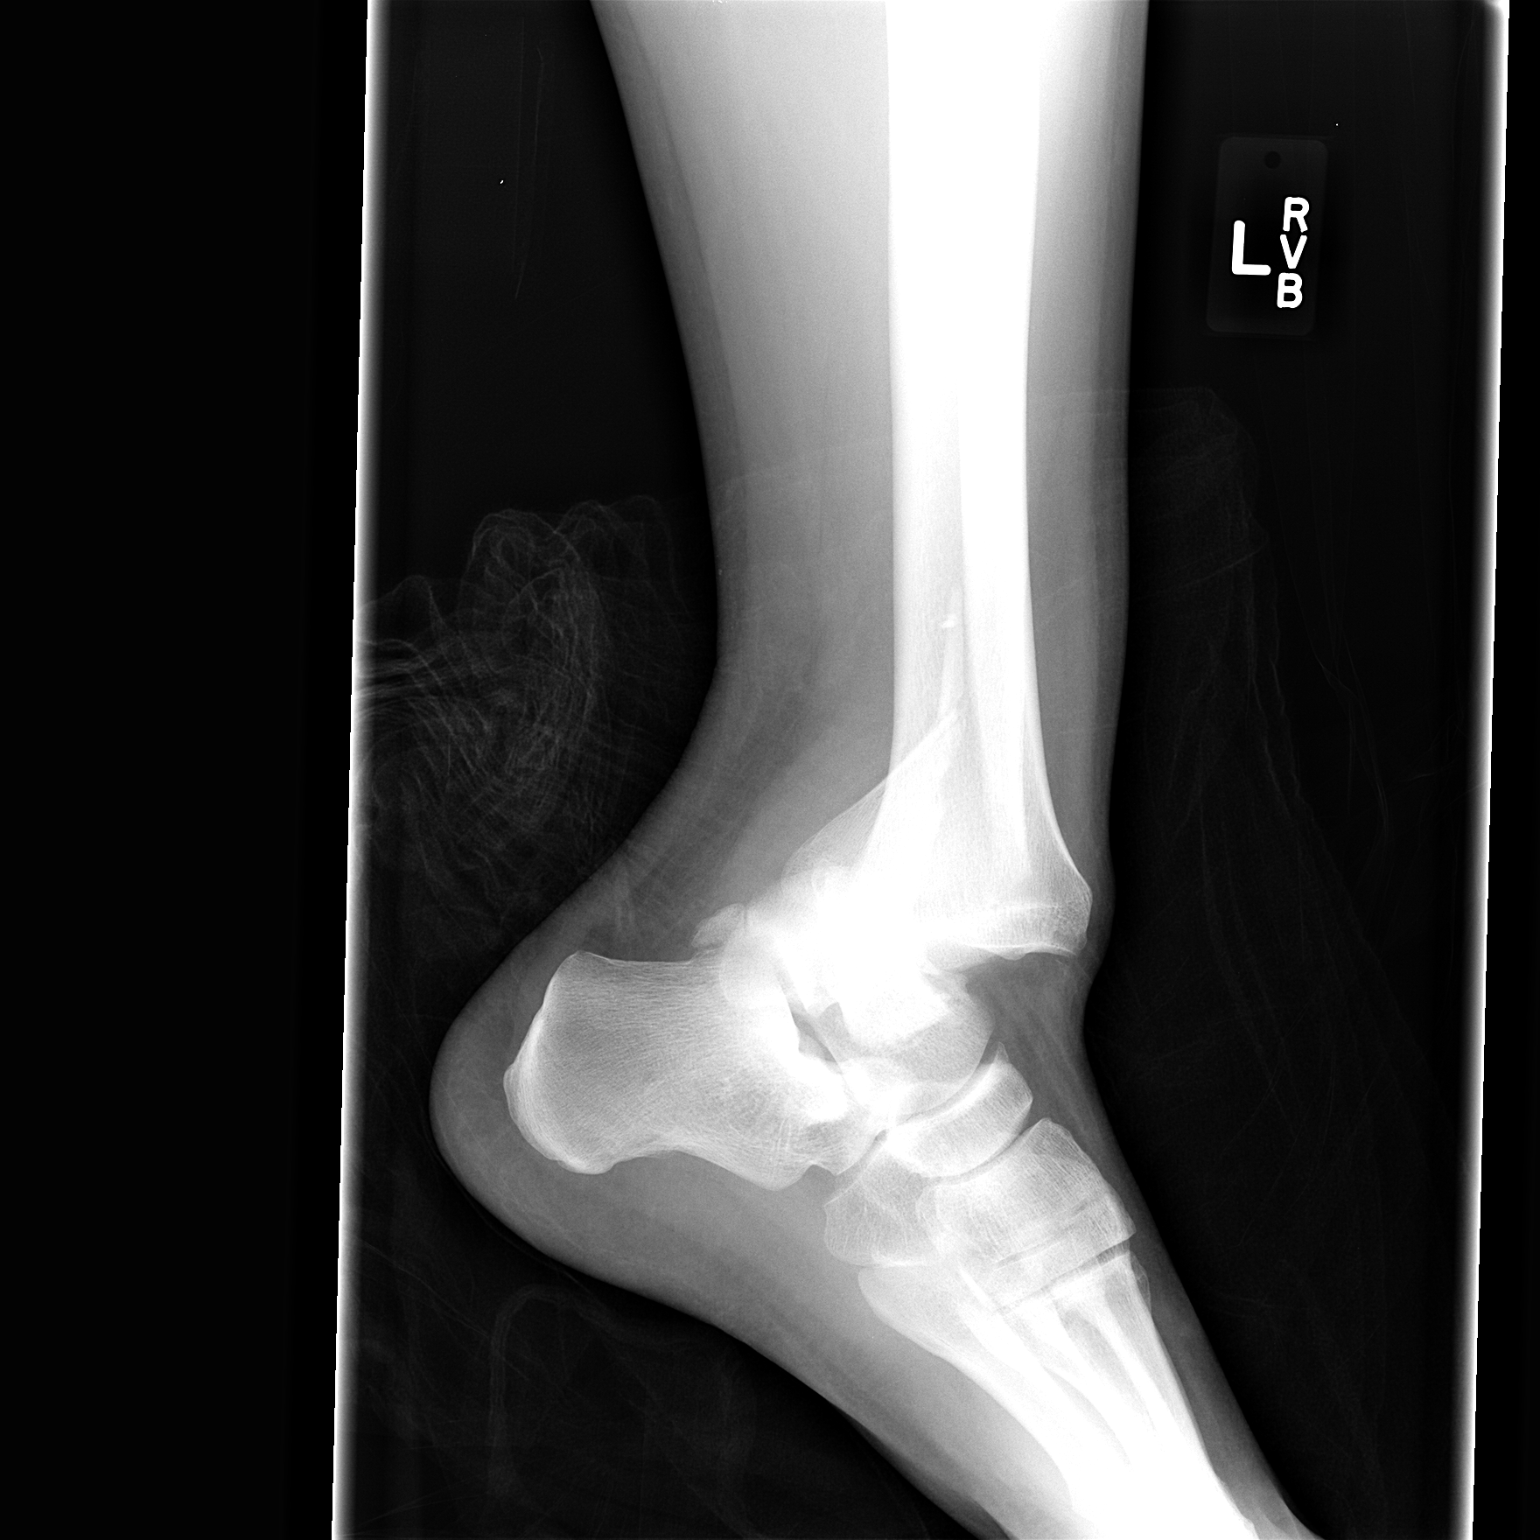

[view not recorded (2 of 2)]
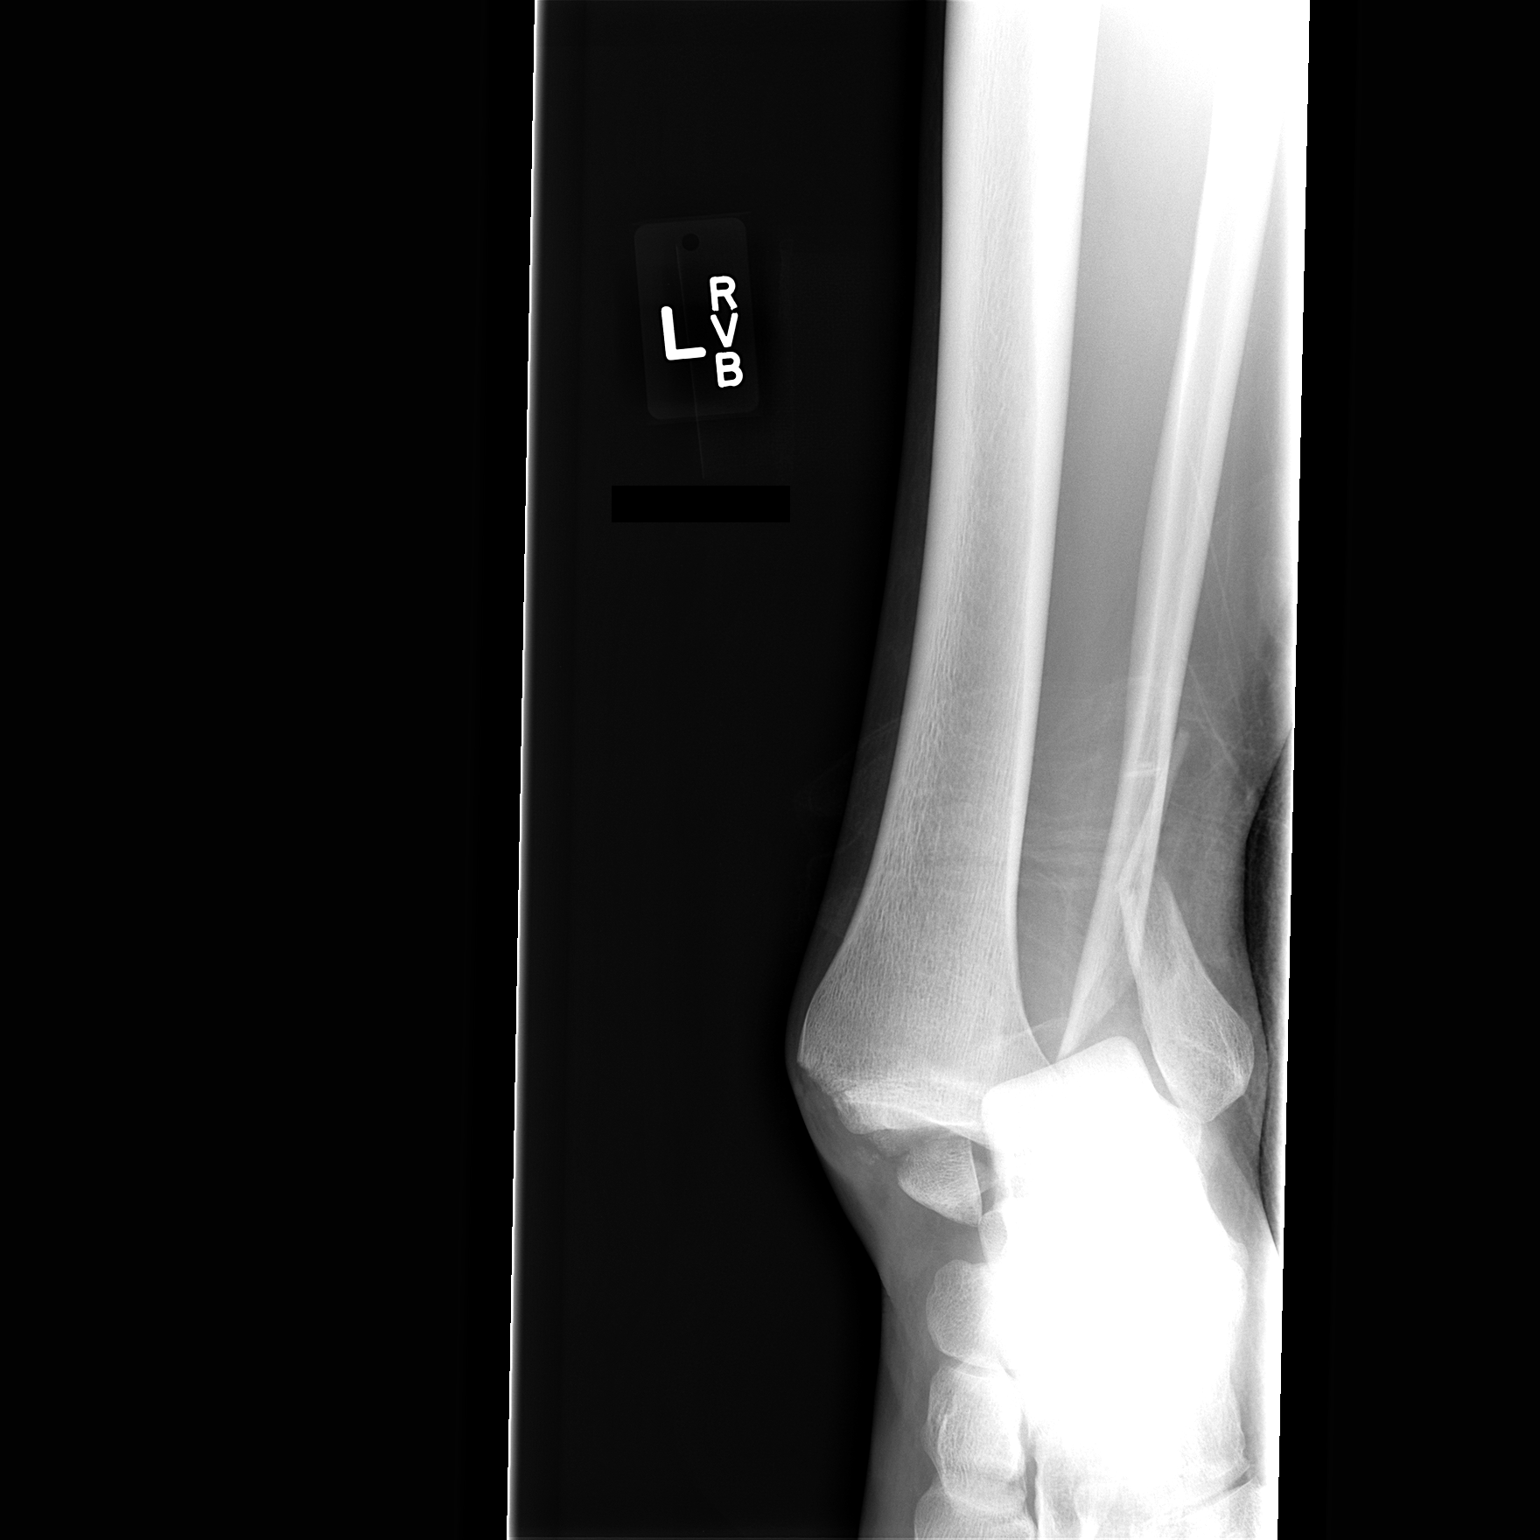

[2 of 2 positions shown; findings below may reference images not displayed]

FINDINGS: Complex fracture dislocation with posterior talar
dislocation, displaced medial malleolus fracture, displaced oblique
distal fibular fracture above the ankle mortise and a probable
posterior tibial fracture.
IMPRESSION: Left ankle fracture dislocation.

## 2012-02-16 ENCOUNTER — Telehealth: Payer: Self-pay | Admitting: *Deleted

## 2012-02-16 NOTE — Telephone Encounter (Signed)
Shawna Orleans More Detail >>      Wendall Stade, MD      Sent: Wed February 16, 2012  9:25 AM            Message      Needs f/u with Tenny Craw ASAP         Attached Reports    The sender attached the following reports to this message:                     Results   Berniece Pap Echo (Order [id number])        Berniece Pap Echo  Status: Final result     Visible to patient: This result is not viewable by the patient.     Next appt: None                 Result Narrative        [facility]*                        [address]                          [place], [place] [address]                              [phone]   --------------------------------------------------------------------  Echocardiography   (Report amended )   Patient:    Erik Bryan, Erik Bryan  MR #:       [id number]  Study Date: 08/14/2008  Gender:     M  Age:        27  Height:     180.3cm  Weight:     81.6kg  BSA:        2.52m^2  Pt. Status:  Room:    SONOGRAPHER  Luvenia Redden, RDCS   ORDERING     Dietrich Pates, MD   REFERRING    Dietrich Pates, MD   ADMITTING    Corinda Gubler, [facility]   ATTENDING    [place], [facility]   PERFORMING   Corinda Gubler, Office  cc:   --------------------------------------------------------------------  Indications:   Chest pain 786.51. Dyspnea 786.09. Dizziness 780.4.   --------------------------------------------------------------------  History:  PMH: Pulmonic valve disease. Wolff-Parkinson White  syndrome.   --------------------------------------------------------------------  Study Conclusions   1. Left ventricle: The cavity size was normal. Systolic function was     mildly reduced. The estimated ejection fraction was 45%. Diffuse     hypokinesis.  2. Ventricular septum: The contour showed systolic flattening.  3. Right ventricle: The cavity size was mildly to moderately     dilated. Wall thickness was moderately increased.  4. Right atrium: The atrium  was moderately to severely dilated.  5. Tricuspid valve: There was malcoaptation of the valve leaflets.     Moderate regurgitation directed toward the free wall.  6. Pulmonic valve: The findings are consistent with mild stenosis.     Moderate regurgitation. Peak gradient: 29mm Hg (S).  7. Pulmonary arteries: Systolic pressure was moderately to severely     increased. PA peak pressure: 60mm Hg (S).  Echocardiography. M-mode, complete 2D, spectral Doppler, and color  Doppler. Height: Height: 180.3cm. Height: 71in. Weight: Weight:  81.6kg. Weight: 179.6lb. Body mass index: BMI: 25.1kg/m^2. Body  surface area: BSA: 2.78m^2. Patient status: Outpatient. Location:  Psychologist, sport and exercise.   --------------------------------------------------------------------  Left ventricle: The cavity size  was normal. Systolic function was  mildly reduced. The estimated ejection fraction was 45%. Diffuse  hypokinesis.  Aorta: The aorta was normal, not dilated, and non-diseased.  Mitral valve: Structurally normal valve. Leaflet separation was  normal. Doppler: Transvalvular velocity was within the normal range.  There was no evidence for stenosis. No significant regurgitation.  Left atrium: The atrium was normal in size.  Right ventricle: The cavity size was mildly to moderately dilated.  Wall thickness was moderately increased. Systolic function was  normal.  Ventricular septum: The contour showed systolic flattening.  Pulmonic valve: Poorly visualized. Doppler: The findings are  consistent with mild stenosis. Moderate regurgitation. Peak  gradient: 29mm Hg (S).  Tricuspid valve: There was malcoaptation of the valve leaflets.  Doppler: Moderate regurgitation directed toward the free wall.  Pulmonary artery: Systolic pressure was moderately to severely  increased.  Right atrium: The atrium was moderately to severely dilated.  Pericardium: The pericardium was normal in appearance.  Systemic veins:  Inferior vena  cava: The vessel was normal in size; the respirophasic  diameter changes were in the normal range (= 50%); findings are  consistent with normal central venous pressure.  Post procedure conclusions  Ascending Aorta:   - The aorta was normal, not dilated, and non-diseased.   --------------------------------------------------------------------   2D measurements            Normal  Doppler measurements      Normal  Left ventricle                     Main pulmonary artery  LVID ED,         45 mm     43-52   Pressure, S      60 mm Hg =30  chord, PLAX                        Mitral valve  LVID ES,       32.2 mm     23-38   Peak E vel     63.2 cm/s  -------  chord, PLAX                        Peak A vel     63.7 cm/s  -------  FS, chord,       28 %      >29     Deceleration    299 ms    150-230  PLAX                               time  LVPW, ED       9.16 mm     ------  Peak E/A ratio    1       -------  IVS/LVPW        1.1        <1.3    Tricuspid valve  ratio, ED                          Regurg peak     289 cm/s  -------  Ventricular septum                 vel  IVS, ED        10.1 mm     ------  Peak RV-RA       33  mm Hg -------  Aorta                              gradient, S  Root diam, ED    35 mm     ------  Systemic veins  Left atrium                        Estimated CVP    10 mm Hg -------  AP dim           38 mm     ------  Right ventricle  AP dim index   1.88 cm/m^2 <2.2    Pressure, S      43 mm Hg <30                                     Pulmonic valve                                     Peak vel, S     269 cm/s  -------                                     Peak gradient,   29 mm Hg -------                                     Derry Lory, MD  2010-08-11T19:[id number]         Specimen Collected: 08/14/08 12:59 PM Last Resulted: 08/14/08  7:11 PM                               Encounter    View Encounter       Result Information    Status    Final result  (08/14/2008  7:11 PM)    Provider Status: Ordered        Lab Information    ECHART REPORTS                    Order-Level Documents:    There are no order-level documents.        Berniece Pap Echo (Order [id number])  Echocardiography  Order: [id number]   Date: 08/14/2008  Department: [facility])  Ordering/Authorizing: Pricilla Riffle, MD               Pricilla Riffle, MD  NPI: [id number]             Order Information    Order Date/Time Release Date/Time Start Date/Time End Date/Time    08/14/2008 12:59 PM None 08/14/2008 12:59 PM None       Order Details    Frequency Duration Priority Order Class    None None Routine Normal                    Collection Information    Collection Date Collection Time Resulting Agency    08/14/2008 12:59 PM ECHART REPORTS  Patient Information    Patient Name Sex DOB SSN    Erik Bryan, Erik Bryan Male June 27, 1985 [id number]       Additional Information    Associated Reports    Priority and Order Details    Collection Information.        Order Administrator -- -- --    Authorizing Provider Pricilla Riffle, MD [phone] -- [email or web address]         Encounter    View Encounter        Order-Level Documents:    There are no order-level documents.      PT TO CALL ON TUES NEEDS F/U WITH DR  ROSS ASAP PER DR Eden Emms .Zack Seal

## 2012-02-29 NOTE — Telephone Encounter (Signed)
PT HAS APPT WITH DR ROSS ON  03-10-12 AT  11:00 AM./CY

## 2012-03-10 ENCOUNTER — Encounter: Payer: Medicaid Other | Admitting: Internal Medicine

## 2012-04-06 ENCOUNTER — Encounter (HOSPITAL_COMMUNITY): Payer: Self-pay | Admitting: *Deleted

## 2012-04-06 ENCOUNTER — Emergency Department (HOSPITAL_COMMUNITY)
Admission: EM | Admit: 2012-04-06 | Discharge: 2012-04-06 | Disposition: A | Discharge status: Home / Self Care | Payer: Self-pay | Attending: Emergency Medicine | Admitting: Emergency Medicine

## 2012-04-06 DIAGNOSIS — R52 Pain, unspecified: Secondary | ICD-10-CM | POA: Insufficient documentation

## 2012-04-06 DIAGNOSIS — Z8679 Personal history of other diseases of the circulatory system: Secondary | ICD-10-CM | POA: Insufficient documentation

## 2012-04-06 DIAGNOSIS — B9789 Other viral agents as the cause of diseases classified elsewhere: Secondary | ICD-10-CM | POA: Insufficient documentation

## 2012-04-06 DIAGNOSIS — S30860A Insect bite (nonvenomous) of lower back and pelvis, initial encounter: Secondary | ICD-10-CM | POA: Insufficient documentation

## 2012-04-06 DIAGNOSIS — F17200 Nicotine dependence, unspecified, uncomplicated: Secondary | ICD-10-CM | POA: Insufficient documentation

## 2012-04-06 DIAGNOSIS — B349 Viral infection, unspecified: Secondary | ICD-10-CM

## 2012-04-06 DIAGNOSIS — Q249 Congenital malformation of heart, unspecified: Secondary | ICD-10-CM | POA: Insufficient documentation

## 2012-04-06 DIAGNOSIS — L299 Pruritus, unspecified: Secondary | ICD-10-CM | POA: Insufficient documentation

## 2012-04-06 DIAGNOSIS — W57XXXA Bitten or stung by nonvenomous insect and other nonvenomous arthropods, initial encounter: Secondary | ICD-10-CM | POA: Insufficient documentation

## 2012-04-06 DIAGNOSIS — R011 Cardiac murmur, unspecified: Secondary | ICD-10-CM | POA: Insufficient documentation

## 2012-04-06 DIAGNOSIS — I456 Pre-excitation syndrome: Secondary | ICD-10-CM | POA: Insufficient documentation

## 2012-04-06 DIAGNOSIS — Y939 Activity, unspecified: Secondary | ICD-10-CM | POA: Insufficient documentation

## 2012-04-06 DIAGNOSIS — Y929 Unspecified place or not applicable: Secondary | ICD-10-CM | POA: Insufficient documentation

## 2012-04-06 LAB — CBC WITH DIFFERENTIAL/PLATELET
Basophils Absolute: 0 K/uL (ref 0.0–0.1)
Basophils Relative: 0 % (ref 0–1)
Eosinophils Absolute: 0.1 K/uL (ref 0.0–0.7)
Eosinophils Relative: 1 % (ref 0–5)
HCT: 42.5 % (ref 39.0–52.0)
Hemoglobin: 14.4 g/dL (ref 13.0–17.0)
Lymphocytes Relative: 35 % (ref 12–46)
Lymphs Abs: 2.9 K/uL (ref 0.7–4.0)
MCH: 30.1 pg (ref 26.0–34.0)
MCHC: 33.9 g/dL (ref 30.0–36.0)
MCV: 88.9 fL (ref 78.0–100.0)
Monocytes Absolute: 0.4 10*3/uL (ref 0.1–1.0)
Monocytes Relative: 5 % (ref 3–12)
Neutro Abs: 4.8 10*3/uL (ref 1.7–7.7)
Neutrophils Relative %: 58 % (ref 43–77)
Platelets: 186 K/uL (ref 150–400)
RBC: 4.78 MIL/uL (ref 4.22–5.81)
RDW: 13.1 % (ref 11.5–15.5)
WBC: 8.3 K/uL (ref 4.0–10.5)

## 2012-04-06 LAB — BASIC METABOLIC PANEL
BUN: 14 mg/dL (ref 6–23)
CO2: 27 mEq/L (ref 19–32)
Chloride: 102 mEq/L (ref 96–112)
Creatinine, Ser: 1.05 mg/dL (ref 0.50–1.35)
GFR calc Af Amer: >90 mL/min (ref >90)
Glucose, Bld: 86 mg/dL (ref 70–99)

## 2012-04-06 LAB — BASIC METABOLIC PANEL WITH GFR
Calcium: 9.7 mg/dL (ref 8.4–10.5)
GFR calc non Af Amer: >90 mL/min (ref >90)
Potassium: 4.2 meq/L (ref 3.5–5.1)
Sodium: 137 meq/L (ref 135–145)

## 2012-04-06 MED ORDER — DOXYCYCLINE HYCLATE 100 MG PO CAPS: 100.0000 mg | ORAL_CAPSULE | Freq: Two times a day (BID) | ORAL | Status: DC

## 2012-04-06 NOTE — ED Notes (Signed)
Pt had tick pulled off of his back two days ago; states today hasn't felt good like he was starting to get sick; has reddened area on back with rash;

## 2012-04-06 NOTE — ED Provider Notes (Signed)
History     CSN: [id number]  Arrival date & time 04/06/12  1946   First MD Initiated Contact with Patient 04/06/12 2149      Chief Complaint  Patient presents with  . Insect Bite    (Consider location/radiation/quality/duration/timing/severity/associated sxs/prior treatment) HPI Comments: Patient with tick bite x2 days. States that his not feeling well for the past couple of days. He endorses vague body aches. Denies any fever, chills, nausea, vomiting, diarrhea, constipation. Denies any chest pain or shortness of breath. He also states that the side of the tick bite has been growing increasingly red. He states that it itches.  The history is provided by the patient. No language interpreter was used.    Past Medical History  Diagnosis Date  . 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 Atrial fibrillation   . Wolf-Parkinson-White syndrome     Past Surgical History  Procedure Laterality Date  . Wisdom tooth extraction    . A flutter ablation      No family history on file.  History  Substance Use Topics  . Smoking status: Current Every Day Smoker -- 0.50 packs/day  . Smokeless tobacco: Not on file  . Alcohol Use: Yes     Comment: occasionally      Review of Systems  All other systems reviewed and are negative.    Allergies  Review of patient's allergies indicates no known allergies.  Home Medications   Current Outpatient Rx  Name  Route  Sig  Dispense  Refill  . ibuprofen (ADVIL,MOTRIN) 200 MG tablet   Oral   Take 400 mg by mouth every 6 (six) hours as needed. For pain            BP 121/72  Pulse 70  Temp(Src) 98.4 F (36.9 C) (Oral)  Resp 18  SpO2 99%  Physical Exam  Nursing note and vitals reviewed. Constitutional: He is oriented to person, place, and time. He appears well-developed and well-nourished.  HENT:  Head: Normocephalic and atraumatic.  Eyes: Conjunctivae and EOM are normal. Pupils are equal, round, and  reactive to light. Right eye exhibits no discharge. Left eye exhibits no discharge. No scleral icterus.  Neck: Normal range of motion. Neck supple. No JVD present.  Cardiovascular: Normal rate, regular rhythm and normal heart sounds.  Exam reveals no gallop and no friction rub.   No murmur heard. Pulmonary/Chest: Effort normal and breath sounds normal. No respiratory distress. He has no wheezes. He has no rales. He exhibits no tenderness.  Abdominal: Soft. He exhibits no distension.  Musculoskeletal: Normal range of motion. He exhibits no edema and no tenderness.  Neurological: He is alert and oriented to person, place, and time.  Skin: Skin is warm and dry.  2x4 cm area of redness versus cellulitis on the patient's right-sided back  Psychiatric: He has a normal mood and affect. His behavior is normal. Judgment and thought content normal.    ED Course  Procedures (including critical care time)  Labs Reviewed  CBC WITH DIFFERENTIAL  BASIC METABOLIC PANEL  URINALYSIS, ROUTINE W REFLEX MICROSCOPIC   No results found.  ED ECG REPORT  I personally interpreted this EKG   Date: 04/06/2012   Rate: 65  Rhythm: normal sinus rhythm  QRS Axis: normal  Intervals: normal  ST/T Wave abnormalities: nonspecific ST changes  Conduction Disutrbances:incomplete RBBB  Narrative Interpretation: Also WPW  Old EKG Reviewed: unchanged    1. Tick bite  2. Viral illness       MDM  Patient with tick bite, possible beginning cellulitis. Will treat with doxycycline. Discussed with Dr. Oletta Lamas, who agrees with the plan. Return precautions have been given. Patient understands and agrees with the plan. He is well-appearing, and is not in any apparent distress.        [name], PA-C 04/07/12 [phone]

## 2012-04-07 NOTE — ED Provider Notes (Signed)
Medical screening examination/treatment/procedure(s) were performed by non-physician practitioner and as supervising physician I was immediately available for consultation/collaboration.   Elky Funches Y. Abbagale Goguen, MD 04/07/12 2154 

## 2012-04-27 ENCOUNTER — Encounter: Payer: Self-pay | Admitting: Internal Medicine

## 2012-04-27 ENCOUNTER — Ambulatory Visit (INDEPENDENT_AMBULATORY_CARE_PROVIDER_SITE_OTHER): Payer: Self-pay | Admitting: Internal Medicine

## 2012-04-27 VITALS — BP 131/76 | HR 89 | Ht 71.0 in | Wt 179.6 lb

## 2012-04-27 DIAGNOSIS — I370 Nonrheumatic pulmonary valve stenosis: Secondary | ICD-10-CM

## 2012-04-27 DIAGNOSIS — I379 Nonrheumatic pulmonary valve disorder, unspecified: Secondary | ICD-10-CM

## 2012-04-27 NOTE — Progress Notes (Signed)
HPI Patient is a 27 yo with a history of pulmonic stenosis/pulmonic insuff.  He also has a history of WPW (s/p ablation) and atrial fib (seen in ER in 2010) HE was last in cardiology clinic in 2010 SInce seen he says his breathing is good.  Denies CP  No palpitations.   No Known Allergies  Current Outpatient Prescriptions  Medication Sig Dispense Refill  . ibuprofen (ADVIL,MOTRIN) 200 MG tablet Take 400 mg by mouth every 6 (six) hours as needed. For pain        No current facility-administered medications for this visit.    Past Medical History  Diagnosis Date  . 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 Atrial fibrillation   . Wolf-Parkinson-White syndrome     Past Surgical History  Procedure Laterality Date  . Wisdom tooth extraction    . A flutter ablation      No family history on file.  History   Social History  . Marital Status: Single    Spouse Name: N/A    Number of Children: N/A  . Years of Education: N/A   Occupational History  . Not on file.   Social History Main Topics  . Smoking status: Current Every Day Smoker -- 0.50 packs/day  . Smokeless tobacco: Not on file  . Alcohol Use: Yes     Comment: occasionally  . Drug Use: No  . Sexually Active: Yes    Birth Control/ Protection: None   Other Topics Concern  . Not on file   Social History Narrative  . No narrative on file    Review of Systems:  All systems reviewed.  They are negative to the above problem except as previously stated.  Vital Signs: BP 131/76  Pulse 89  Ht 5\' 11"  (1.803 m)  Wt 179 lb 9.6 oz (81.466 kg)  BMI 25.06 kg/m2  Physical Exam Patient is in NAD HEENT:  Normocephalic, atraumatic. EOMI, PERRLA.  Neck: JVP is normal.  No bruits.  Lungs: clear to auscultation. No rales no wheezes.  Heart: Regular rate and rhythm. Normal S1, S2. No S3.  Gr II/Vi systolic murmur PMI not displaced.  Abdomen:  Supple, nontender. Normal bowel sounds. No masses. No  hepatomegaly.  Extremities:   Good distal pulses throughout. No lower extremity edema.  Musculoskeletal :moving all extremities.  Neuro:   alert and oriented x3.  CN II-XII grossly intact.  EKG NSR  Incomplete RBBB. Assessment and Plan:  1.  PS/PI  Will set up for echo  2.  Arrhythmia.  Patient has remained asymptomatic.  F/U in clinic in 18 months.

## 2012-04-27 NOTE — Patient Instructions (Addendum)
Your physician has requested that you have an echocardiogram. Echocardiography is a painless test that uses sound waves to create images of your heart. It provides your doctor with information about the size and shape of your heart and how well your heart's chambers and valves are working. This procedure takes approximately one hour. There are no restrictions for this procedure.  Your physician wants you to follow-up in: 18 months with Dr. Tenny Craw.  You will receive a reminder letter in the mail two months in advance. If you don't receive a letter, please call our office to schedule the follow-up appointment.

## 2012-05-03 ENCOUNTER — Ambulatory Visit (HOSPITAL_COMMUNITY): Payer: Self-pay | Attending: Cardiovascular Disease

## 2012-05-03 DIAGNOSIS — F17200 Nicotine dependence, unspecified, uncomplicated: Secondary | ICD-10-CM | POA: Insufficient documentation

## 2012-05-03 DIAGNOSIS — I288 Other diseases of pulmonary vessels: Secondary | ICD-10-CM | POA: Insufficient documentation

## 2012-05-03 DIAGNOSIS — I370 Nonrheumatic pulmonary valve stenosis: Secondary | ICD-10-CM

## 2012-05-03 DIAGNOSIS — F411 Generalized anxiety disorder: Secondary | ICD-10-CM | POA: Insufficient documentation

## 2012-05-03 DIAGNOSIS — I4891 Unspecified atrial fibrillation: Secondary | ICD-10-CM | POA: Insufficient documentation

## 2012-05-03 DIAGNOSIS — R011 Cardiac murmur, unspecified: Secondary | ICD-10-CM

## 2012-05-03 DIAGNOSIS — I498 Other specified cardiac arrhythmias: Secondary | ICD-10-CM | POA: Insufficient documentation

## 2012-05-03 NOTE — Progress Notes (Signed)
Echocardiogram performed.  

## 2012-07-29 ENCOUNTER — Emergency Department (HOSPITAL_COMMUNITY)
Admission: EM | Admit: 2012-07-29 | Discharge: 2012-07-30 | Disposition: A | Discharge status: Home / Self Care | Payer: Self-pay | Attending: Emergency Medicine | Admitting: Emergency Medicine

## 2012-07-29 ENCOUNTER — Encounter (HOSPITAL_COMMUNITY): Payer: Self-pay | Admitting: Emergency Medicine

## 2012-07-29 DIAGNOSIS — Y939 Activity, unspecified: Secondary | ICD-10-CM | POA: Insufficient documentation

## 2012-07-29 DIAGNOSIS — R011 Cardiac murmur, unspecified: Secondary | ICD-10-CM | POA: Insufficient documentation

## 2012-07-29 DIAGNOSIS — Z8679 Personal history of other diseases of the circulatory system: Secondary | ICD-10-CM | POA: Insufficient documentation

## 2012-07-29 DIAGNOSIS — R509 Fever, unspecified: Secondary | ICD-10-CM | POA: Insufficient documentation

## 2012-07-29 DIAGNOSIS — W57XXXA Bitten or stung by nonvenomous insect and other nonvenomous arthropods, initial encounter: Secondary | ICD-10-CM

## 2012-07-29 DIAGNOSIS — T6391XA Toxic effect of contact with unspecified venomous animal, accidental (unintentional), initial encounter: Secondary | ICD-10-CM | POA: Insufficient documentation

## 2012-07-29 DIAGNOSIS — Y929 Unspecified place or not applicable: Secondary | ICD-10-CM | POA: Insufficient documentation

## 2012-07-29 DIAGNOSIS — F17200 Nicotine dependence, unspecified, uncomplicated: Secondary | ICD-10-CM | POA: Insufficient documentation

## 2012-07-29 MED ORDER — DOXYCYCLINE HYCLATE 100 MG PO TABS
200.0000 mg | ORAL_TABLET | Freq: Once | ORAL | Status: AC
  Administered 2012-07-29: 200 mg via ORAL
  Filled 2012-07-29: qty 2

## 2012-07-29 NOTE — ED Notes (Signed)
Patient reports that he removed a deer tick from his lower leg earlier tonight

## 2012-07-29 NOTE — ED Provider Notes (Signed)
CSN: [id number]     Arrival date & time 07/29/12  2251 History  This chart was scribed for Kyung Bacca, PA-C working with Sunnie Nielsen, MD by Greggory Stallion, ED scribe. This patient was seen in room WTR7/WTR7 and the patient's care was started at 11:02 PM.   No chief complaint on file.  The history is provided by the patient. No language interpreter was used.   HPI Comments: Erik Bryan is a 27 y.o. male who presents to the Emergency Department complaining of an insect bite to his lower leg that happened this morning. He states he poured alcohol over the tick and let it fall off this morning. Pt states that since then, the area has gotten red and swollen. He states it hasn't drained. Pt states he hasn't taken anything for pain. Pt denies fever as associated symptoms.   Past Medical History  Diagnosis Date  . 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 Atrial fibrillation   . Wolf-Parkinson-White syndrome    Past Surgical History  Procedure Laterality Date  . Wisdom tooth extraction    . A flutter ablation     No family history on file. History  Substance Use Topics  . Smoking status: Current Every Day Smoker -- 0.50 packs/day  . Smokeless tobacco: Not on file  . Alcohol Use: Yes     Comment: occasionally    Review of Systems  Constitutional: Negative for fever.  Skin:       Insect bite  All other systems reviewed and are negative.    Allergies  Review of patient's allergies indicates no known allergies.  Home Medications   Current Outpatient Rx  Name  Route  Sig  Dispense  Refill  . ibuprofen (ADVIL,MOTRIN) 200 MG tablet   Oral   Take 400 mg by mouth every 6 (six) hours as needed. For pain           BP 122/68  Pulse 78  Temp(Src) 97.5 F (36.4 C) (Oral)  Resp 17  Ht 5\' 11"  (1.803 m)  Wt 170 lb (77.111 kg)  BMI 23.72 kg/m2  SpO2 98%  Physical Exam  Nursing note and vitals reviewed. Constitutional: He is oriented to  person, place, and time. He appears well-developed and well-nourished. No distress.  HENT:  Head: Normocephalic and atraumatic.  Eyes:  Normal appearance  Neck: Normal range of motion.  Pulmonary/Chest: Effort normal.  Musculoskeletal: Normal range of motion.  Neurological: He is alert and oriented to person, place, and time.  Skin:  1-67mm skin-colored papular lesion distal right shin.  Mild localized edema and erythema.  Mild ttp.  Otherwise nml skin.  Psychiatric: He has a normal mood and affect. His behavior is normal.    ED Course   Procedures (including critical care time)  DIAGNOSTIC STUDIES: Oxygen Saturation is 98% on RA, normal by my interpretation.    COORDINATION OF CARE: 11:31 PM-Discussed treatment plan which includes antibiotic with pt at bedside and pt agreed to plan. Advised pt to come back to the ED if symptoms worsen.    Labs Reviewed - No data to display No results found. 1. Tick bite     MDM  Healhty 27yo M presents w/ deer tick bite to right lower leg.  He removed tick himself w/ alcohol at home but the site is now painful.  He requests a dose of doxycycline.  No signs of infection on exam.  200mg  po doxy  administered, I recommended tylenol/ibuprofen for pain at home and signs of tick-borne illness as well as secondary skin infection discussed. 12:01 AM       I personally performed the services described in this documentation, which was scribed in my presence. The recorded information has been reviewed and is accurate.   Otilio Miu, PA-C 07/30/12 0003

## 2012-07-30 NOTE — ED Provider Notes (Signed)
Medical screening examination/treatment/procedure(s) were performed by non-physician practitioner and as supervising physician I was immediately available for consultation/collaboration.  Marlin Jarrard, MD 07/30/12 0350 

## 2012-09-06 ENCOUNTER — Telehealth: Payer: Self-pay | Admitting: Internal Medicine

## 2012-09-06 NOTE — Telephone Encounter (Signed)
New prob  Pt is wanting the results of his echo from April.

## 2012-09-06 NOTE — Telephone Encounter (Signed)
Left message for pt to call.

## 2012-09-19 ENCOUNTER — Telehealth: Payer: Self-pay | Admitting: Internal Medicine

## 2012-09-19 NOTE — Telephone Encounter (Signed)
Spoke with pt, aware of echo results from April.

## 2012-09-19 NOTE — Telephone Encounter (Signed)
New problem  Debra,This patient was calling to get his echo result from April.

## 2012-09-19 NOTE — Telephone Encounter (Signed)
Spoke with pt, aware of echo results. 

## 2012-11-09 ENCOUNTER — Other Ambulatory Visit: Payer: Self-pay

## 2012-11-10 ENCOUNTER — Emergency Department (HOSPITAL_COMMUNITY)
Admission: EM | Admit: 2012-11-10 | Discharge: 2012-11-10 | Disposition: A | Discharge status: Home / Self Care | Payer: Self-pay | Attending: Emergency Medicine | Admitting: Emergency Medicine

## 2012-11-10 DIAGNOSIS — Y9301 Activity, walking, marching and hiking: Secondary | ICD-10-CM | POA: Insufficient documentation

## 2012-11-10 DIAGNOSIS — S91309A Unspecified open wound, unspecified foot, initial encounter: Secondary | ICD-10-CM | POA: Insufficient documentation

## 2012-11-10 DIAGNOSIS — Y92009 Unspecified place in unspecified non-institutional (private) residence as the place of occurrence of the external cause: Secondary | ICD-10-CM | POA: Insufficient documentation

## 2012-11-10 DIAGNOSIS — Z23 Encounter for immunization: Secondary | ICD-10-CM | POA: Insufficient documentation

## 2012-11-10 DIAGNOSIS — S91331A Puncture wound without foreign body, right foot, initial encounter: Secondary | ICD-10-CM

## 2012-11-10 DIAGNOSIS — Z8679 Personal history of other diseases of the circulatory system: Secondary | ICD-10-CM | POA: Insufficient documentation

## 2012-11-10 DIAGNOSIS — F17200 Nicotine dependence, unspecified, uncomplicated: Secondary | ICD-10-CM | POA: Insufficient documentation

## 2012-11-10 DIAGNOSIS — R011 Cardiac murmur, unspecified: Secondary | ICD-10-CM | POA: Insufficient documentation

## 2012-11-10 DIAGNOSIS — W268XXA Contact with other sharp object(s), not elsewhere classified, initial encounter: Secondary | ICD-10-CM | POA: Insufficient documentation

## 2012-11-10 MED ORDER — AMOXICILLIN-POT CLAVULANATE 875-125 MG PO TABS: 1.0000 | ORAL_TABLET | Freq: Two times a day (BID) | ORAL | Status: DC

## 2012-11-10 MED ORDER — TETANUS-DIPHTH-ACELL PERTUSSIS 5-2.5-18.5 LF-MCG/0.5 IM SUSP
0.5000 mL | Freq: Once | INTRAMUSCULAR | Status: AC
  Administered 2012-11-10: 0.5 mL via INTRAMUSCULAR
  Filled 2012-11-10: qty 0.5

## 2012-11-10 NOTE — ED Provider Notes (Signed)
CSN: [id number]     Arrival date & time 11/10/12  2144 History  This chart was scribed for non-physician practitioner Ivonne Andrew, PA-C working with Gavin Pound. Oletta Lamas, MD by Caryn Bee, ED Scribe. This patient was seen in room WTR6/WTR6 and the patient's care was started at 10:52 PM.    Chief Complaint  Patient presents with  . Puncture Wound   HPI HPI Comments: Erik Bryan is a 27 y.o. male who presents to the Emergency Department complaining of sudden onset puncture wound to right foot near great toe that occurred about 3 hours ago. Pt states that he stepped on a nail while walking through his yard and a nail stabbed through his boot. He reports washing the wound out with water. Pt denies weakness, numbness, or any other symptoms. Pt is unsure if tetanus is UTD. He denies allergies to any medications. Pt does not have PCP.   Past Medical History  Diagnosis Date  . 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 Atrial fibrillation   . Wolf-Parkinson-White syndrome    Past Surgical History  Procedure Laterality Date  . Wisdom tooth extraction    . A flutter ablation     No family history on file. History  Substance Use Topics  . Smoking status: Current Every Day Smoker -- 0.50 packs/day  . Smokeless tobacco: Not on file  . Alcohol Use: Yes     Comment: occasionally    Review of Systems  Skin: Positive for wound (puncture wound to right foot near great toe).  Neurological: Negative for weakness and numbness.  All other systems reviewed and are negative.    Allergies  Review of patient's allergies indicates no known allergies.  Home Medications   Current Outpatient Rx  Name  Route  Sig  Dispense  Refill  . ibuprofen (ADVIL,MOTRIN) 200 MG tablet   Oral   Take 200 mg by mouth every 6 (six) hours as needed for headache (pain). For pain          Triage Vitals: BP 120/79  Pulse 71  Temp(Src) 97.9 F (36.6 C) (Oral)  Resp 16  SpO2  98%  Physical Exam  Nursing note and vitals reviewed. Constitutional: He is oriented to person, place, and time. He appears well-developed and well-nourished. No distress.  HENT:  Head: Normocephalic and atraumatic.  Eyes: EOM are normal.  Neck: Neck supple. No tracheal deviation present.  Cardiovascular: Normal rate.   Pulmonary/Chest: Effort normal. No respiratory distress.  Musculoskeletal: Normal range of motion.  Neurological: He is alert and oriented to person, place, and time.  Skin: Skin is warm and dry.  Puncture wound to right foot near great toe.  Psychiatric: He has a normal mood and affect. His behavior is normal.    ED Course  Procedures  DIAGNOSTIC STUDIES: Oxygen Saturation is 98% on room air, normal by my interpretation.    COORDINATION OF CARE: 10:55 PM-Discussed treatment plan which includes tetanus and wound care with pt at bedside and pt agreed to plan. Will also plan to give prescription for antibiotics to prevent infection.   MDM   1. Puncture wound of foot, right, initial encounter      I personally performed the services described in this documentation, which was scribed in my presence. The recorded information has been reviewed and is accurate.    Angus Seller, PA-C 11/11/12 [phone]

## 2012-11-10 NOTE — ED Notes (Signed)
Pt has a puncture wound to R foot. Pt states he stepped on a nail in a board. Pt has puncture wound to bottom of foot near R great toe. Bleeding controlled. Pt does not know when he had a tetanus shot last.

## 2012-11-11 NOTE — ED Provider Notes (Signed)
Medical screening examination/treatment/procedure(s) were performed by non-physician practitioner and as supervising physician I was immediately available for consultation/collaboration.  EKG Interpretation   None         Gavin Pound. [place], MD 11/11/12 1035

## 2013-04-26 ENCOUNTER — Emergency Department (HOSPITAL_COMMUNITY)
Admission: EM | Admit: 2013-04-26 | Discharge: 2013-04-26 | Discharge status: Against Medical Advice | Payer: Self-pay | Attending: Emergency Medicine | Admitting: Emergency Medicine

## 2013-04-26 ENCOUNTER — Encounter (HOSPITAL_COMMUNITY): Payer: Self-pay | Admitting: Emergency Medicine

## 2013-04-26 DIAGNOSIS — F17200 Nicotine dependence, unspecified, uncomplicated: Secondary | ICD-10-CM | POA: Insufficient documentation

## 2013-04-26 DIAGNOSIS — R5383 Other fatigue: Principal | ICD-10-CM

## 2013-04-26 DIAGNOSIS — R5381 Other malaise: Secondary | ICD-10-CM | POA: Insufficient documentation

## 2013-04-26 DIAGNOSIS — R011 Cardiac murmur, unspecified: Secondary | ICD-10-CM | POA: Insufficient documentation

## 2013-04-26 LAB — CBC
HEMATOCRIT: 39.7 % (ref 39.0–52.0)
Hemoglobin: 13.4 g/dL (ref 13.0–17.0)
MCH: 30.2 pg (ref 26.0–34.0)
MCHC: 33.8 g/dL (ref 30.0–36.0)
MCV: 89.6 fL (ref 78.0–100.0)
PLATELETS: 176 10*3/uL (ref 150–400)
RBC: 4.43 MIL/uL (ref 4.22–5.81)
RDW: 13 % (ref 11.5–15.5)
WBC: 6.2 10*3/uL (ref 4.0–10.5)

## 2013-04-26 LAB — BASIC METABOLIC PANEL
BUN: 10 mg/dL (ref 6–23)
CO2: 25 meq/L (ref 19–32)
CREATININE: 1.08 mg/dL (ref 0.50–1.35)
Calcium: 9.6 mg/dL (ref 8.4–10.5)
Chloride: 102 mEq/L (ref 96–112)
GFR calc Af Amer: >90 mL/min (ref >90)
GFR calc non Af Amer: >90 mL/min (ref >90)
Glucose, Bld: 100 mg/dL — ABNORMAL HIGH (ref 70–99)
Potassium: 4.3 mEq/L (ref 3.7–5.3)
Sodium: 140 mEq/L (ref 137–147)

## 2013-04-26 LAB — I-STAT TROPONIN, ED: TROPONIN I, POC: 0.01 ng/mL (ref 0.00–0.08)

## 2013-04-26 NOTE — ED Notes (Addendum)
Pt not in waiting room called outside of ED doors. x2

## 2013-04-26 NOTE — ED Notes (Signed)
ED EKG had been done before order was put in

## 2013-04-26 NOTE — ED Notes (Addendum)
Pt called x 1. No response. Not in waiting.

## 2013-04-26 NOTE — ED Notes (Addendum)
Pt reports being tired and weak x 5 days and last night had chest pain that started at 7pm and resolved by 10 pm. Pt is not c/o of any chest pain at this time. Pt has hx of Afib and cardioversion. NAD. Skin warm and dry. Denies Sob and n/v/d.

## 2013-06-03 ENCOUNTER — Emergency Department (HOSPITAL_COMMUNITY)
Admission: EM | Admit: 2013-06-03 | Discharge: 2013-06-03 | Disposition: A | Discharge status: Home / Self Care | Payer: Self-pay | Attending: Emergency Medicine | Admitting: Emergency Medicine

## 2013-06-03 ENCOUNTER — Encounter (HOSPITAL_COMMUNITY): Payer: Self-pay | Admitting: Emergency Medicine

## 2013-06-03 DIAGNOSIS — IMO0002 Reserved for concepts with insufficient information to code with codable children: Secondary | ICD-10-CM | POA: Insufficient documentation

## 2013-06-03 DIAGNOSIS — R011 Cardiac murmur, unspecified: Secondary | ICD-10-CM | POA: Insufficient documentation

## 2013-06-03 DIAGNOSIS — F17200 Nicotine dependence, unspecified, uncomplicated: Secondary | ICD-10-CM | POA: Insufficient documentation

## 2013-06-03 DIAGNOSIS — R61 Generalized hyperhidrosis: Secondary | ICD-10-CM | POA: Insufficient documentation

## 2013-06-03 DIAGNOSIS — Y929 Unspecified place or not applicable: Secondary | ICD-10-CM | POA: Insufficient documentation

## 2013-06-03 DIAGNOSIS — W57XXXA Bitten or stung by nonvenomous insect and other nonvenomous arthropods, initial encounter: Secondary | ICD-10-CM

## 2013-06-03 DIAGNOSIS — M791 Myalgia, unspecified site: Secondary | ICD-10-CM

## 2013-06-03 DIAGNOSIS — IMO0001 Reserved for inherently not codable concepts without codable children: Secondary | ICD-10-CM | POA: Insufficient documentation

## 2013-06-03 DIAGNOSIS — Y9389 Activity, other specified: Secondary | ICD-10-CM | POA: Insufficient documentation

## 2013-06-03 DIAGNOSIS — J3489 Other specified disorders of nose and nasal sinuses: Secondary | ICD-10-CM | POA: Insufficient documentation

## 2013-06-03 DIAGNOSIS — Z8679 Personal history of other diseases of the circulatory system: Secondary | ICD-10-CM | POA: Insufficient documentation

## 2013-06-03 MED ORDER — DOXYCYCLINE HYCLATE 100 MG PO CAPS: 100.0000 mg | ORAL_CAPSULE | Freq: Two times a day (BID) | ORAL | Status: DC

## 2013-06-03 MED ORDER — DOXYCYCLINE HYCLATE 100 MG PO TABS
100.0000 mg | ORAL_TABLET | Freq: Once | ORAL | Status: AC
  Administered 2013-06-03: 100 mg via ORAL
  Filled 2013-06-03: qty 1

## 2013-06-03 NOTE — ED Notes (Signed)
Pt arrived to the Ed with a complaint of being bit by a tick on the left big toe.  Pt came today because he is not feeling well and the insect bite has become red and swollen

## 2013-06-03 NOTE — Discharge Instructions (Signed)
Warm compresses to the area.  Return to the ER for worsening redness, swelling, or drainage of pus.  Take medications as prescribed.   Insect Bite Mosquitoes, flies, fleas, bedbugs, and many other insects can bite. Insect bites are different from insect stings. A sting is when venom is injected into the skin. Some insect bites can transmit infectious diseases. SYMPTOMS  Insect bites usually turn red, swell, and itch for 2 to 4 days. They often go away on their own. TREATMENT  Your caregiver may prescribe antibiotic medicines if a bacterial infection develops in the bite. HOME CARE INSTRUCTIONS  Do not scratch the bite area.  Keep the bite area clean and dry. Wash the bite area thoroughly with soap and water.  Put ice or cool compresses on the bite area.  Put ice in a plastic bag.  Place a towel between your skin and the bag.  Leave the ice on for 20 minutes, 4 times a day for the first 2 to 3 days, or as directed.  You may apply a baking soda paste, cortisone cream, or calamine lotion to the bite area as directed by your caregiver. This can help reduce itching and swelling.  Only take over-the-counter or prescription medicines as directed by your caregiver.  If you are given antibiotics, take them as directed. Finish them even if you start to feel better. You may need a tetanus shot if:  You cannot remember when you had your last tetanus shot.  You have never had a tetanus shot.  The injury broke your skin. If you get a tetanus shot, your arm may swell, get red, and feel warm to the touch. This is common and not a problem. If you need a tetanus shot and you choose not to have one, there is a rare chance of getting tetanus. Sickness from tetanus can be serious. SEEK IMMEDIATE MEDICAL CARE IF:   You have increased pain, redness, or swelling in the bite area.  You see a red line on the skin coming from the bite.  You have a fever.  You have joint pain.  You have a headache  or neck pain.  You have unusual weakness.  You have a rash.  You have chest pain or shortness of breath.  You have abdominal pain, nausea, or vomiting.  You feel unusually tired or sleepy. MAKE SURE YOU:   Understand these instructions.  Will watch your condition.  Will get help right away if you are not doing well or get worse. Document Released: 01/29/2004 Document Revised: 03/15/2011 Document Reviewed: 07/22/2010 [facility] Patient Information 2014 [place].  Tick Bite Information Ticks are insects that attach themselves to the skin and draw blood for food. There are various types of ticks. Common types include wood ticks and deer ticks. Most ticks live in shrubs and grassy areas. Ticks can climb onto your body when you make contact with leaves or grass where the tick is waiting. The most common places on the body for ticks to attach themselves are the scalp, neck, armpits, waist, and groin. Most tick bites are harmless, but sometimes ticks carry germs that cause diseases. These germs can be spread to a person during the tick's feeding process. The chance of a disease spreading through a tick bite depends on:   The type of tick.  Time of year.   How long the tick is attached.   Geographic location.  HOW CAN YOU PREVENT TICK BITES? Take these steps to help prevent tick bites  when you are outdoors:  Wear protective clothing. Long sleeves and long pants are best.   Wear white clothes so you can see ticks more easily.  Tuck your pant legs into your socks.   If walking on a trail, stay in the middle of the trail to avoid brushing against bushes.  Avoid walking through areas with long grass.  Put insect repellent on all exposed skin and along boot tops, pant legs, and sleeve cuffs.   Check clothing, hair, and skin repeatedly and before going inside.   Brush off any ticks that are not attached.  Take a shower or bath as soon as possible after being  outdoors.  WHAT IS THE PROPER WAY TO REMOVE A TICK? Ticks should be removed as soon as possible to help prevent diseases caused by tick bites. 1. If latex gloves are available, put them on before trying to remove a tick.  2. Using fine-point tweezers, grasp the tick as close to the skin as possible. You may also use curved forceps or a tick removal tool. Grasp the tick as close to its head as possible. Avoid grasping the tick on its body. 3. Pull gently with steady upward pressure until the tick lets go. Do not twist the tick or jerk it suddenly. This may break off the tick's head or mouth parts. 4. Do not squeeze or crush the tick's body. This could force disease-carrying fluids from the tick into your body.  5. After the tick is removed, wash the bite area and your hands with soap and water or other disinfectant such as alcohol. 6. Apply a small amount of antiseptic cream or ointment to the bite site.  7. Wash and disinfect any instruments that were used.  Do not try to remove a tick by applying a hot match, petroleum jelly, or fingernail polish to the tick. These methods do not work and may increase the chances of disease being spread from the tick bite.  WHEN SHOULD YOU SEEK MEDICAL CARE? Contact your health care provider if you are unable to remove a tick from your skin or if a part of the tick breaks off and is stuck in the skin.  After a tick bite, you need to be aware of signs and symptoms that could be related to diseases spread by ticks. Contact your health care provider if you develop any of the following in the days or weeks after the tick bite:  Unexplained fever.  Rash. A circular rash that appears days or weeks after the tick bite may indicate the possibility of Lyme disease. The rash may resemble a target with a bull's-eye and may occur at a different part of your body than the tick bite.  Redness and swelling in the area of the tick bite.   Tender, swollen lymph glands.    Diarrhea.   Weight loss.   Cough.   Fatigue.   Muscle, joint, or bone pain.   Abdominal pain.   Headache.   Lethargy or a change in your level of consciousness.  Difficulty walking or moving your legs.   Numbness in the legs.   Paralysis.  Shortness of breath.   Confusion.   Repeated vomiting.  Document Released: 12/19/1999 Document Revised: 10/11/2012 Document Reviewed: 05/31/2012 [facility] Patient Information 2014 [place], [place].

## 2013-06-03 NOTE — ED Provider Notes (Signed)
CSN: [id number]     Arrival date & time 06/03/13  0118 History   First MD Initiated Contact with Patient 06/03/13 [phone]     Chief Complaint  Patient presents with  . Insect Bite     (Consider location/radiation/quality/duration/timing/severity/associated sxs/prior Treatment) HPI 16 six-year-old male presents to emergency room with complaint of left great toe pain and swelling after tick bite.  Patient reports today he has felt unwell with generalized body aches and runny nose  Tick bite was 2 days ago.  He does not feel the tick was in place longer than 24 hours.  He denies any fever or chills, no drainage of pus from the wound. Past Medical History  Diagnosis Date  . 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 Atrial fibrillation   . Wolf-Parkinson-White syndrome    Past Surgical History  Procedure Laterality Date  . Wisdom tooth extraction    . A flutter ablation     History reviewed. No pertinent family history. History  Substance Use Topics  . Smoking status: Current Every Day Smoker -- 0.50 packs/day  . Smokeless tobacco: Not on file  . Alcohol Use: Yes     Comment: occasionally    Review of Systems   See History of Present Illness; otherwise all other systems are reviewed and negative  Allergies  Review of patient's allergies indicates no known allergies.  Home Medications   Prior to Admission medications   Medication Sig Start Date End Date Taking? Authorizing Provider  ibuprofen (ADVIL,MOTRIN) 200 MG tablet Take 200 mg by mouth every 6 (six) hours as needed for headache or moderate pain (pain). For pain   Yes Historical Provider, MD  doxycycline (VIBRAMYCIN) 100 MG capsule Take 1 capsule (100 mg total) by mouth 2 (two) times daily. 06/03/13   Olivia Mackie, MD   BP 123/72  Pulse 66  Temp(Src) 98.1 F (36.7 C) (Oral)  Resp 16  SpO2 98% Physical Exam  Nursing note and vitals reviewed. Constitutional: He appears well-developed and  well-nourished. No distress.  Cardiovascular: Normal rate, regular rhythm, normal heart sounds and intact distal pulses.  Exam reveals no gallop and no friction rub.   No murmur heard. Pulmonary/Chest: Effort normal and breath sounds normal. No respiratory distress. He has no wheezes. He has no rales. He exhibits no tenderness.  Musculoskeletal: Normal range of motion. He exhibits tenderness. He exhibits no edema.  Patient has small papules with surrounding erythema at the nail fold of his great toe in the center.  Erythema is mild, does not have any streaking.  There is no purulence.  No fluctuance to the area.  Skin: He is diaphoretic.    ED Course  Procedures (including critical care time) Labs Review Labs Reviewed - No data to display  Imaging Review No results found.   EKG Interpretation None      MDM   Final diagnoses:  Tick bite  Myalgia    62 six-year-old male with tick bite, possible early localized infection.  Doubt seriously patient has Lyme or other tick borne disease.  Will start on doxycycline to cover for occult tick borne illness as well is covering for localized cellulitis     Olivia Mackie, MD 06/03/13 [phone]

## 2013-07-31 ENCOUNTER — Encounter: Payer: Self-pay | Admitting: Internal Medicine

## 2013-09-05 ENCOUNTER — Encounter (HOSPITAL_COMMUNITY): Payer: Self-pay | Admitting: Emergency Medicine

## 2013-09-05 ENCOUNTER — Emergency Department (HOSPITAL_COMMUNITY)
Admission: EM | Admit: 2013-09-05 | Discharge: 2013-09-05 | Disposition: A | Discharge status: Home / Self Care | Payer: Self-pay | Attending: Emergency Medicine | Admitting: Emergency Medicine

## 2013-09-05 DIAGNOSIS — T2010XA Burn of first degree of head, face, and neck, unspecified site, initial encounter: Secondary | ICD-10-CM

## 2013-09-05 DIAGNOSIS — Z792 Long term (current) use of antibiotics: Secondary | ICD-10-CM | POA: Insufficient documentation

## 2013-09-05 DIAGNOSIS — X131XXA Other contact with steam and other hot vapors, initial encounter: Secondary | ICD-10-CM

## 2013-09-05 DIAGNOSIS — Y9389 Activity, other specified: Secondary | ICD-10-CM | POA: Insufficient documentation

## 2013-09-05 DIAGNOSIS — X12XXXA Contact with other hot fluids, initial encounter: Secondary | ICD-10-CM | POA: Insufficient documentation

## 2013-09-05 DIAGNOSIS — T2016XA Burn of first degree of forehead and cheek, initial encounter: Secondary | ICD-10-CM | POA: Insufficient documentation

## 2013-09-05 DIAGNOSIS — T2000XA Burn of unspecified degree of head, face, and neck, unspecified site, initial encounter: Secondary | ICD-10-CM | POA: Insufficient documentation

## 2013-09-05 DIAGNOSIS — Y9289 Other specified places as the place of occurrence of the external cause: Secondary | ICD-10-CM | POA: Insufficient documentation

## 2013-09-05 DIAGNOSIS — F17200 Nicotine dependence, unspecified, uncomplicated: Secondary | ICD-10-CM | POA: Insufficient documentation

## 2013-09-05 DIAGNOSIS — R011 Cardiac murmur, unspecified: Secondary | ICD-10-CM | POA: Insufficient documentation

## 2013-09-05 MED ORDER — OXYCODONE-ACETAMINOPHEN 5-325 MG PO TABS
2.0000 | ORAL_TABLET | Freq: Once | ORAL | Status: AC
  Administered 2013-09-05: 2 via ORAL
  Filled 2013-09-05: qty 2

## 2013-09-05 MED ORDER — MELOXICAM 15 MG PO TABS: 15.0000 mg | ORAL_TABLET | Freq: Every day | ORAL | Status: DC
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
## 2013-09-05 NOTE — Discharge Instructions (Signed)
Take mobic as needed for pain. Refer to attached documents for more information. Return to the ED with worsening or concerning symptoms.  °
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
## 2013-09-09 NOTE — ED Provider Notes (Signed)
Medical screening examination/treatment/procedure(s) were performed by non-physician practitioner and as supervising physician I was immediately available for consultation/collaboration.   EKG Interpretation None        Cadence Haslam, MD 09/09/13 0704 

## 2013-10-19 ENCOUNTER — Other Ambulatory Visit: Payer: Self-pay
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
## 2014-02-15 ENCOUNTER — Ambulatory Visit (INDEPENDENT_AMBULATORY_CARE_PROVIDER_SITE_OTHER): Payer: Self-pay | Admitting: Internal Medicine

## 2014-02-15 ENCOUNTER — Encounter: Payer: Self-pay | Admitting: Internal Medicine

## 2014-02-15 ENCOUNTER — Telehealth: Payer: Self-pay | Admitting: Internal Medicine

## 2014-02-15 VITALS — BP 118/68 | HR 86 | Ht 71.0 in | Wt 176.0 lb

## 2014-02-15 DIAGNOSIS — R002 Palpitations: Secondary | ICD-10-CM

## 2014-02-15 NOTE — Patient Instructions (Addendum)
Your physician recommends that you continue on your current medications as directed. Please refer to the Current Medication list given to you today.  Your physician has recommended that you wear an event monitor. Event monitors are medical devices that record the heart's electrical activity. Doctors most often us these monitors to diagnose arrhythmias. Arrhythmias are problems with the speed or rhythm of the heartbeat. The monitor is a small, portable device. You can wear one while you do your normal daily activities. This is usually used to diagnose what is causing palpitations/syncope (passing out).   

## 2014-02-15 NOTE — Telephone Encounter (Signed)
02-15-14 Patient was giving application for [employer] Hardship once he return the application I will fax to Caren GriffinsDorothy W. @ [employer].  Patient also receive application to apply for the [facility] Card he was instructed to mail [employer] application to me and mail [facility] application to [facility].

## 2014-02-28 ENCOUNTER — Telehealth: Payer: Self-pay | Admitting: Internal Medicine

## 2014-02-28 NOTE — Telephone Encounter (Signed)
On 2 -12-16 Patient was giving the information to fill out for hardship for [employer] Monitor.   Also for the orange Card for [facility].  He is to return the [employer] info to me and send the info for the [place] card to [facility].

## 2014-03-13 ENCOUNTER — Telehealth: Payer: Self-pay | Admitting: *Deleted

## 2014-03-13 NOTE — Telephone Encounter (Signed)
I have discussed with cardiologist at [facility] Patient needs to be seen by them      Can be seen locally ([address] office) Dr Johnanna Schneiders Krasuski is the cardiologist    I have made appt for March 10 at 10 AM        He should have event monitor and Cardiac MRI (Dr Delton SeeNelson to read) prior     ----- Message -----     From: Pricilla RifflePaula Ross V, MD     Sent: 02/22/2014 10:58 AM      To: Pricilla RifflePaula Ross V, MD, Lendon KaMichalene Naida Escalante, RN        What is status of event monitor    ----- Message -----     From: Pricilla RifflePaula Ross V, MD     Sent: 02/16/2014  4:35 PM      To: Pricilla RifflePaula Ross V, MD        Spoke with patient's mom, Rosey Batheresa.  They were aware of an appointment but not the details. She is pretty sure he will be off work to be able to make the appointment.  She said he has not been able to bring the paperwork back yet to get the [facility] Card.   He will bring by tomorrow since his [facility] appointment is in this building.

## 2014-03-14 ENCOUNTER — Encounter: Payer: Self-pay | Admitting: *Deleted

## 2014-03-14 ENCOUNTER — Telehealth: Payer: Self-pay | Admitting: Internal Medicine

## 2014-03-14 NOTE — Progress Notes (Signed)
Patient ID: Erik Bryan, male   DOB: 08-03-1985, 29 y.o.   MRN: [id number] Patient enrolled with [employer] for a 30 day cardiac event monitor pending approval of hardship application.  ACT Monitor will be applied in our office since the patient does not have a land line telephone.

## 2014-03-14 NOTE — Telephone Encounter (Signed)
Received application for Hardship -  Patient enrolled in [employer]- [name] notified and application mailed.

## 2014-04-03 ENCOUNTER — Encounter: Payer: Self-pay | Admitting: Radiology

## 2014-04-03 ENCOUNTER — Encounter (INDEPENDENT_AMBULATORY_CARE_PROVIDER_SITE_OTHER): Payer: Self-pay

## 2014-04-03 DIAGNOSIS — R002 Palpitations: Secondary | ICD-10-CM

## 2014-04-03 DIAGNOSIS — R42 Dizziness and giddiness: Secondary | ICD-10-CM

## 2014-04-03 NOTE — Progress Notes (Signed)
Patient ID: Erik Bryan, male   DOB: June 18, 1985, 29 y.o.   MRN: [id number] [employer] 30 day monitor applied. EOS 05-03-14

## 2014-05-16 ENCOUNTER — Telehealth: Payer: Self-pay | Admitting: *Deleted

## 2014-05-16 NOTE — Telephone Encounter (Signed)
Event monitor reviewed by Dr. Tenny Crawoss "Sinus rhythm. No arrhythmia detected"  Informed patient's mom, Karma Leweresa McDonald. Requested that I forward to Dr. Deatra JamesKrasuski, [facility]

## 2015-01-07 ENCOUNTER — Encounter (HOSPITAL_COMMUNITY): Payer: Self-pay | Admitting: Emergency Medicine

## 2015-01-07 ENCOUNTER — Emergency Department (HOSPITAL_COMMUNITY)
Admission: EM | Admit: 2015-01-07 | Discharge: 2015-01-08 | Disposition: A | Discharge status: Home / Self Care | Payer: Managed Care, Other (non HMO) | Attending: Emergency Medicine | Admitting: Emergency Medicine

## 2015-01-07 ENCOUNTER — Emergency Department (HOSPITAL_COMMUNITY): Payer: Managed Care, Other (non HMO)

## 2015-01-07 DIAGNOSIS — Z8679 Personal history of other diseases of the circulatory system: Secondary | ICD-10-CM | POA: Insufficient documentation

## 2015-01-07 DIAGNOSIS — F17200 Nicotine dependence, unspecified, uncomplicated: Secondary | ICD-10-CM | POA: Insufficient documentation

## 2015-01-07 DIAGNOSIS — R011 Cardiac murmur, unspecified: Secondary | ICD-10-CM | POA: Diagnosis not present

## 2015-01-07 DIAGNOSIS — R079 Chest pain, unspecified: Secondary | ICD-10-CM | POA: Diagnosis present

## 2015-01-07 DIAGNOSIS — R002 Palpitations: Secondary | ICD-10-CM | POA: Insufficient documentation

## 2015-01-07 HISTORY — DX: Cardiomegaly: I51.7

## 2015-01-07 LAB — BASIC METABOLIC PANEL
Anion gap: 6 (ref 5–15)
BUN: 13 mg/dL (ref 6–20)
CO2: 29 mmol/L (ref 22–32)
Calcium: 9.4 mg/dL (ref 8.9–10.3)
Chloride: 105 mmol/L (ref 101–111)
Creatinine, Ser: 1.01 mg/dL (ref 0.61–1.24)
GFR calc Af Amer: >60 mL/min (ref >60)
GLUCOSE: 70 mg/dL (ref 65–99)
Potassium: 4 mmol/L (ref 3.5–5.1)
Sodium: 140 mmol/L (ref 135–145)

## 2015-01-07 LAB — CBC
HCT: 43.1 % (ref 39.0–52.0)
HEMOGLOBIN: 14.3 g/dL (ref 13.0–17.0)
MCH: 30.8 pg (ref 26.0–34.0)
MCHC: 33.2 g/dL (ref 30.0–36.0)
MCV: 92.7 fL (ref 78.0–100.0)
Platelets: 208 10*3/uL (ref 150–400)
RBC: 4.65 MIL/uL (ref 4.22–5.81)
RDW: 13.3 % (ref 11.5–15.5)
WBC: 7.6 10*3/uL (ref 4.0–10.5)

## 2015-01-07 LAB — I-STAT TROPONIN, ED: Troponin i, poc: 0.02 ng/mL (ref 0.00–0.08)

## 2015-01-07 NOTE — ED Notes (Signed)
Pt states he's been having intermittent worsening chest pain over the last week. 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

## 2015-01-07 NOTE — Discharge Instructions (Signed)

## 2015-01-08 NOTE — ED Provider Notes (Signed)
CSN: [id number]     Arrival date & time 01/07/15  1903 History   First MD Initiated Contact with Patient 01/07/15 2243     Chief Complaint  Patient presents with  . Chest Pain      HPI  Patient presents for evaluation of chest pain. 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  Also has a history of atrial flutter status post ablation.  He has functioned well through his life without chest pain or dyspnea. [facility]  His last appointment was last year. He states he was told that his heart was enlarging. And that "there might be a procedure he would need at some point".  He describes her episodes of sharp sudden pain in the left lateral chest. These happen sometimes reduce a brief second. Often are described as being associated with a feeling of movement or a "thump" in his chest. Occasionally will feel palpitations afterwards. He states it does remind him somewhat of his atrial flutter although it does not persist. He does not get symptomatically dizziness or lightheadedness. The pain is brief, almost instantaneous. No pressure. No syncope or presyncope or lightheadedness.  Past Medical History  Diagnosis Date  . 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 Atrial fibrillation (HCC)   . Wolf-Parkinson-White syndrome   . Enlarged heart    Past Surgical History  Procedure Laterality Date  . Wisdom tooth extraction    . A flutter ablation    . Cardiac surgery    . Pulmonary artery balloon angioplasty     History reviewed. No pertinent family history. Social History  Substance Use Topics  . Smoking status: Current Every Day Smoker -- 0.50 packs/day  . Smokeless tobacco: None  . Alcohol Use: Yes     Comment: occasionally    Review of Systems  Constitutional: Negative for fever, chills, diaphoresis, appetite change and fatigue.  HENT: Negative  for mouth sores, sore throat and trouble swallowing.   Eyes: Negative for visual disturbance.  Respiratory: Negative for cough, chest tightness, shortness of breath and wheezing.   Cardiovascular: Positive for chest pain and palpitations.  Gastrointestinal: Negative for nausea, vomiting, abdominal pain, diarrhea and abdominal distention.  Endocrine: Negative for polydipsia, polyphagia and polyuria.  Genitourinary: Negative for dysuria, frequency and hematuria.  Musculoskeletal: Negative for gait problem.  Skin: Negative for color change, pallor and rash.  Neurological: Negative for dizziness, syncope, light-headedness and headaches.  Hematological: Does not bruise/bleed easily.  Psychiatric/Behavioral: Negative for behavioral problems and confusion.      Allergies  Review of patient's allergies indicates no known allergies.  Home Medications   Prior to Admission medications   Medication Sig Start Date End Date Taking? Authorizing Provider  acetaminophen (TYLENOL) 325 MG tablet Take 325 mg by mouth every 6 (six) hours as needed for mild pain, moderate pain or headache.   Yes Historical Provider, MD  aspirin 81 MG chewable tablet Chew 162 mg by mouth daily as needed for mild pain or headache.   Yes Historical Provider, MD  ibuprofen (ADVIL,MOTRIN) 200 MG tablet Take 200 mg by mouth every 6 (six) hours as needed for headache or moderate pain (pain). For pain   Yes Historical Provider, MD   BP 119/74 mmHg  Pulse 64  Temp(Src) 97.6 F (36.4 C) (Oral)  Resp 18  SpO2 100% Physical Exam  Constitutional:  He is oriented to person, place, and time. He appears well-developed and well-nourished. No distress.  HENT:  Head: Normocephalic.  Eyes: Conjunctivae are normal. Pupils are equal, round, and reactive to light. No scleral icterus.  Neck: Normal range of motion. Neck supple. No thyromegaly present.  Cardiovascular: Normal rate and regular rhythm.  Exam reveals no gallop and no friction  rub.   Murmur heard.  Systolic murmur is present with a grade of 2/6  Systolic murmur heard throughout the precordium.  Pulmonary/Chest: Effort normal and breath sounds normal. No respiratory distress. He has no wheezes. He has no rales.  Abdominal: Soft. Bowel sounds are normal. He exhibits no distension. There is no tenderness. There is no rebound.  Musculoskeletal: Normal range of motion.  Neurological: He is alert and oriented to person, place, and time.  Skin: Skin is warm and dry. No rash noted.  Psychiatric: He has a normal mood and affect. His behavior is normal.    ED Course  Procedures (including critical care time) Labs Review Labs Reviewed  BASIC METABOLIC PANEL  CBC  I-STAT TROPOININ, ED    Imaging Review Dg Chest 2 View  01/07/2015  CLINICAL DATA:  Left side chest pain for 3 weeks EXAM: CHEST  2 VIEW COMPARISON:  02/14/2010 FINDINGS: Borderline cardiomegaly. No acute infiltrate or pleural effusion. No pulmonary edema. Bony thorax is unremarkable. IMPRESSION: No active cardiopulmonary disease. Electronically Signed   By: Natasha Mead M.D.   On: 01/07/2015 20:02   I have personally reviewed and evaluated these images and lab results as part of my medical decision-making.   EKG Interpretation   Date/Time:  Tuesday January 07 2015 19:13:10 EST Ventricular Rate:  78 PR Interval:  153 QRS Duration: 121 QT Interval:  350 QTC Calculation: 399 R Axis:   91 Text Interpretation:  Sinus rhythm Biatrial enlargement RBBB and LPFB  Probable anteroseptal infarct, old Baseline wander in lead(s) II aVR aVF  No significant change since last tracing Confirmed by ZACKOWSKI  MD, SCOTT  [phone]) on 01/07/2015 7:21:18 PM      MDM   Final diagnoses:  Palpitations   Patient with unchanged EKG. Normal electrolytes. His symptoms sound like PVCs. He also may be having runs of some atrial ectopy. Most recent ultrasound he had right atrial enlargement. No indication for further imaging or  testing at this point. I given him a referral back to his cardiologist for further discussion. He may Holter monitoring as well.    Rolland Porter, MD 01/08/15 [phone]

## 2015-05-12 ENCOUNTER — Encounter (HOSPITAL_COMMUNITY): Payer: Self-pay | Admitting: Emergency Medicine

## 2015-05-12 DIAGNOSIS — F17210 Nicotine dependence, cigarettes, uncomplicated: Secondary | ICD-10-CM | POA: Insufficient documentation

## 2015-05-12 DIAGNOSIS — J069 Acute upper respiratory infection, unspecified: Secondary | ICD-10-CM | POA: Insufficient documentation

## 2015-05-12 DIAGNOSIS — R011 Cardiac murmur, unspecified: Secondary | ICD-10-CM | POA: Insufficient documentation

## 2015-05-12 DIAGNOSIS — Z8679 Personal history of other diseases of the circulatory system: Secondary | ICD-10-CM | POA: Insufficient documentation

## 2015-05-12 DIAGNOSIS — Z7982 Long term (current) use of aspirin: Secondary | ICD-10-CM | POA: Insufficient documentation

## 2015-05-12 LAB — CBC WITH DIFFERENTIAL/PLATELET
BASOS PCT: 1 %
Basophils Absolute: 0 10*3/uL (ref 0.0–0.1)
Eosinophils Absolute: 0.2 10*3/uL (ref 0.0–0.7)
Eosinophils Relative: 3 %
HCT: 39.5 % (ref 39.0–52.0)
HEMOGLOBIN: 13.2 g/dL (ref 13.0–17.0)
Lymphocytes Relative: 33 %
Lymphs Abs: 1.9 10*3/uL (ref 0.7–4.0)
MCH: 30.6 pg (ref 26.0–34.0)
MCHC: 33.4 g/dL (ref 30.0–36.0)
MCV: 91.4 fL (ref 78.0–100.0)
MONOS PCT: 9 %
Monocytes Absolute: 0.5 10*3/uL (ref 0.1–1.0)
Neutro Abs: 3.1 10*3/uL (ref 1.7–7.7)
Neutrophils Relative %: 54 %
Platelets: 158 10*3/uL (ref 150–400)
RBC: 4.32 MIL/uL (ref 4.22–5.81)
RDW: 12.4 % (ref 11.5–15.5)
WBC: 5.6 10*3/uL (ref 4.0–10.5)

## 2015-05-12 LAB — BASIC METABOLIC PANEL
Anion gap: 11 (ref 5–15)
BUN: 11 mg/dL (ref 6–20)
CHLORIDE: 103 mmol/L (ref 101–111)
CO2: 24 mmol/L (ref 22–32)
Calcium: 9.2 mg/dL (ref 8.9–10.3)
Creatinine, Ser: 1.11 mg/dL (ref 0.61–1.24)
GFR calc non Af Amer: >60 mL/min (ref >60)
Glucose, Bld: 92 mg/dL (ref 65–99)
POTASSIUM: 3.9 mmol/L (ref 3.5–5.1)
Sodium: 138 mmol/L (ref 135–145)

## 2015-05-12 NOTE — ED Notes (Signed)
Pt. presents with multiple complaints : headache , dry cough , bilateral earache , mild nausea , frequent sneezing and runny nose onset Friday , denies fever or chills.

## 2015-05-13 ENCOUNTER — Emergency Department (HOSPITAL_COMMUNITY)
Admission: EM | Admit: 2015-05-13 | Discharge: 2015-05-13 | Disposition: A | Discharge status: Home / Self Care | Payer: Managed Care, Other (non HMO) | Attending: Emergency Medicine | Admitting: Emergency Medicine

## 2015-05-13 ENCOUNTER — Emergency Department (HOSPITAL_COMMUNITY): Payer: Managed Care, Other (non HMO)

## 2015-05-13 DIAGNOSIS — J069 Acute upper respiratory infection, unspecified: Secondary | ICD-10-CM

## 2015-05-13 DIAGNOSIS — R05 Cough: Secondary | ICD-10-CM

## 2015-05-13 DIAGNOSIS — R059 Cough, unspecified: Secondary | ICD-10-CM

## 2015-05-13 MED ORDER — BENZONATATE 100 MG PO CAPS: 100.0000 mg | ORAL_CAPSULE | Freq: Three times a day (TID) | ORAL | Status: AC

## 2015-05-13 NOTE — ED Provider Notes (Signed)
CSN: [id number]     Arrival date & time 05/12/15  2104 History   First MD Initiated Contact with Patient 05/13/15 0309     Chief Complaint  Patient presents with  . Headache  . Cough  . Otalgia    (Consider location/radiation/quality/duration/timing/severity/associated sxs/prior Treatment) Patient is a 30 y.o. male presenting with headaches, cough, and ear pain. The history is provided by the patient and medical records. No language interpreter was used.  Headache Associated symptoms: congestion, cough, ear pain, fever and sore throat   Associated symptoms: no abdominal pain, no dizziness, no nausea, no neck pain, no vomiting and no weakness   Cough Associated symptoms: ear pain, fever, headaches and sore throat   Associated symptoms: no diaphoresis, no rash, no shortness of breath and no wheezing   Otalgia Associated symptoms: congestion, cough, fever, headaches and sore throat   Associated symptoms: no abdominal pain, no neck pain, no rash and no vomiting    ROCKET GUNDERSON is a 30 y.o. male  with a PMH of afib, WPW who presents to the Emergency Department complaining of nasal congestion 3 days. Associated symptoms include intermittently productive cough, subjective fever, bilateral ear pain, mild headache, sore throat. Denies chest pain, n/v/d, shortness of breath. No medications taken PTA for symptoms. No alleviating or aggravating factors noted.   Past Medical History  Diagnosis Date  . 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 Atrial fibrillation (HCC)   . Wolf-Parkinson-White syndrome   . Enlarged heart    Past Surgical History  Procedure Laterality Date  . Wisdom tooth extraction    . A flutter ablation    . Cardiac surgery    . Pulmonary artery balloon angioplasty     No family history on file. Social History  Substance Use Topics  . Smoking status: Current Every Day Smoker -- 0.00 packs/day  . Smokeless tobacco: None  . Alcohol Use: Yes      Comment: occasionally    Review of Systems  Constitutional: Positive for fever. Negative for diaphoresis.  HENT: Positive for congestion, ear pain and sore throat.   Eyes: Negative for visual disturbance.  Respiratory: Positive for cough. Negative for shortness of breath and wheezing.   Cardiovascular: Negative.   Gastrointestinal: Negative for nausea, vomiting and abdominal pain.  Genitourinary: Negative for dysuria.  Musculoskeletal: Negative for neck pain.  Skin: Negative for rash.  Neurological: Positive for headaches. Negative for dizziness and weakness.      Allergies  Review of patient's allergies indicates no known allergies.  Home Medications   Prior to Admission medications   Medication Sig Start Date End Date Taking? Authorizing Provider  acetaminophen (TYLENOL) 325 MG tablet Take 325 mg by mouth every 6 (six) hours as needed for mild pain, moderate pain or headache.    Historical Provider, MD  aspirin 81 MG chewable tablet Chew 162 mg by mouth daily as needed for mild pain or headache.    Historical Provider, MD  benzonatate (TESSALON) 100 MG capsule Take 1 capsule (100 mg total) by mouth every 8 (eight) hours. 05/13/15   [name] Felicha Frayne, PA-C  ibuprofen (ADVIL,MOTRIN) 200 MG tablet Take 200 mg by mouth every 6 (six) hours as needed for headache or moderate pain (pain). For pain    Historical Provider, MD   BP 131/86 mmHg  Pulse 58  Temp(Src) 97.7 F (36.5 C) (Oral)  Resp 18  Ht  (1.803 m)  Wt  82.101 kg  BMI 25.26 kg/m2  SpO2 98% Physical Exam  Constitutional: He is oriented to person, place, and time. He appears well-developed and well-nourished. No distress.  HENT:  Head: Normocephalic and atraumatic.  OP with erythema, no exudates or tonsillar hypertrophy. + nasal congestion with mucosal edema.   Neck: Normal range of motion. Neck supple.  No meningeal signs.   Cardiovascular: Normal rate, regular rhythm and normal heart sounds.    Pulmonary/Chest: Effort normal.  Lungs are clear to auscultation bilaterally - no w/r/r  Abdominal: Soft. He exhibits no distension. There is no tenderness.  Musculoskeletal: Normal range of motion.  Neurological: He is alert and oriented to person, place, and time.  Skin: Skin is warm and dry. He is not diaphoretic.  Nursing note and vitals reviewed.   ED Course  Procedures (including critical care time) Labs Review Labs Reviewed  CBC WITH DIFFERENTIAL/PLATELET  BASIC METABOLIC PANEL    Imaging Review Dg Chest 2 View  05/13/2015  CLINICAL DATA:  Upper respiratory symptoms for 4 days. EXAM: CHEST  2 VIEW COMPARISON:  01/07/2015 FINDINGS: The lungs are clear. The pulmonary vasculature is normal. Heart size is normal. Hilar and mediastinal contours are unremarkable. There is no pleural effusion. IMPRESSION: No active cardiopulmonary disease. Electronically Signed   By: Ellery Plunkaniel R Mitchell M.D.   On: 05/13/2015 04:23   I have personally reviewed and evaluated these images and lab results as part of my medical decision-making.   EKG Interpretation None      MDM   Final diagnoses:  Cough  URI (upper respiratory infection)   Shawna OrleansWilliam G Dacy is afebrile, non-toxic appearing with a clear lung exam. Mild rhinorrhea and OP with erythema, no exudates. CXR unremarkable. Likely viral URI. Patient is agreeable to symptomatic treatment with close follow up with PCP as needed but spoke at length about emergent, changing, or worsening of symptoms that should prompt return to ER. Patient voices understanding and is agreeable to plan.   Blood pressure 131/86, pulse 58, temperature 97.7 F (36.5 C), temperature source Oral, resp. rate 18, height 5\' 11"  (1.803 m), weight 82.101 kg, SpO2 98 %.    [facility]landJaime Pilcher Sonny Poth, PA-C 05/13/15 [id number]  Tomasita CrumbleAdeleke Oni, MD 05/13/15 [phone]

## 2015-05-13 NOTE — Discharge Instructions (Signed)
1. Medications: flonase and mucinex for nasal congestion (over the counter), tessalon for cough, continue usual home medications 2. Treatment: rest, drink plenty of fluids, take tylenol or ibuprofen for fever control if needed 3. Follow Up: Please follow up with your primary doctor in 3 days if symptoms worsen for discussion of your diagnoses and further evaluation after today's visit; Return to the ER for high fevers, difficulty breathing or other concerning symptoms

## 2015-08-07 ENCOUNTER — Emergency Department (HOSPITAL_COMMUNITY)
Admission: EM | Admit: 2015-08-07 | Discharge: 2015-08-07 | Disposition: A | Discharge status: Home / Self Care | Payer: Managed Care, Other (non HMO) | Attending: Emergency Medicine | Admitting: Emergency Medicine

## 2015-08-07 ENCOUNTER — Encounter (HOSPITAL_COMMUNITY): Payer: Self-pay | Admitting: *Deleted

## 2015-08-07 DIAGNOSIS — M62830 Muscle spasm of back: Secondary | ICD-10-CM

## 2015-08-07 DIAGNOSIS — Y939 Activity, unspecified: Secondary | ICD-10-CM | POA: Insufficient documentation

## 2015-08-07 DIAGNOSIS — X500XXA Overexertion from strenuous movement or load, initial encounter: Secondary | ICD-10-CM | POA: Insufficient documentation

## 2015-08-07 DIAGNOSIS — Z7982 Long term (current) use of aspirin: Secondary | ICD-10-CM | POA: Insufficient documentation

## 2015-08-07 DIAGNOSIS — Z79899 Other long term (current) drug therapy: Secondary | ICD-10-CM | POA: Insufficient documentation

## 2015-08-07 DIAGNOSIS — S39012A Strain of muscle, fascia and tendon of lower back, initial encounter: Secondary | ICD-10-CM | POA: Insufficient documentation

## 2015-08-07 DIAGNOSIS — F17200 Nicotine dependence, unspecified, uncomplicated: Secondary | ICD-10-CM | POA: Insufficient documentation

## 2015-08-07 DIAGNOSIS — Y929 Unspecified place or not applicable: Secondary | ICD-10-CM | POA: Insufficient documentation

## 2015-08-07 DIAGNOSIS — Y999 Unspecified external cause status: Secondary | ICD-10-CM | POA: Insufficient documentation

## 2015-08-07 MED ORDER — CYCLOBENZAPRINE HCL 10 MG PO TABS: 10.0000 mg | ORAL_TABLET | Freq: Two times a day (BID) | ORAL | 0 refills | Status: AC | PRN

## 2015-08-07 NOTE — ED Provider Notes (Signed)
[facility]-EMERGENCY DEPT Provider Note   CSN: [id number] Arrival date & time: 08/07/15  1143  First Provider Contact: 08/07/2015 12:03 PM    By signing my name below, I, Erik Bryan, attest that this documentation has been prepared under the direction and in the presence of Erik Horseman, PA-C. Electronically Signed: [name] [name], ED Scribe. 08/07/15. 12:08 PM.  History   Chief Complaint Chief Complaint  Patient presents with  . Back Pain    HPI HPI Comments: Erik Bryan is a 30 y.o. male who presents to the Emergency Department complaining of sudden onset, constant, left-sided back pain x 1 hr PTA. Pt reports that he was remodeling his bathroom, and when he lifted the commode out of the floor, he slipped on some water and twisted his back. He has no associated symptoms. Pt has taken Advil with mild relief of pain. Denies any bowel/urinary incontinence, numbness or weakness. No other complaints at this time.  The history is provided by the patient. No language interpreter was used.   Past Medical History:  Diagnosis Date  . Atrial fibrillation (HCC)   . Enlarged heart   . 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 Wolf-Parkinson-White syndrome     Patient Active Problem List   Diagnosis Date Noted  . SHORTNESS OF BREATH 10/11/2008  . ANXIETY STATE, UNSPECIFIED 09/04/2008  . 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 SVT/ PSVT/ PAT 08/16/2008  . DIZZINESS 08/13/2008  . CHEST PAIN 08/13/2008    Past Surgical History:  Procedure Laterality Date  . A FLUTTER ABLATION    . CARDIAC SURGERY    . PULMONARY ARTERY BALLOON ANGIOPLASTY    . WISDOM TOOTH EXTRACTION      Home Medications    Prior to Admission medications   Medication Sig Start Date End Date Taking? Authorizing Provider  acetaminophen (TYLENOL) 325 MG tablet Take 325 mg by mouth every 6 (six) hours as needed for mild pain, moderate pain or headache.    Historical Provider,  MD  aspirin 81 MG chewable tablet Chew 162 mg by mouth daily as needed for mild pain or headache.    Historical Provider, MD  benzonatate (TESSALON) 100 MG capsule Take 1 capsule (100 mg total) by mouth every 8 (eight) hours. 05/13/15   [name] Ward, PA-C  ibuprofen (ADVIL,MOTRIN) 200 MG tablet Take 200 mg by mouth every 6 (six) hours as needed for headache or moderate pain (pain). For pain    Historical Provider, MD    Family History History reviewed. No pertinent family history.  Social History Social History  Substance Use Topics  . Smoking status: Current Every Day Smoker    Packs/day: 0.00  . Smokeless tobacco: Not on file  . Alcohol use Yes     Comment: occasionally     Allergies   Review of patient's allergies indicates no known allergies.   Review of Systems Review of Systems  Constitutional: Negative for chills and fever.  Gastrointestinal:       No bowel incontinence  Genitourinary:       No urinary incontinence  Musculoskeletal: Positive for arthralgias, back pain and myalgias.  Neurological: Negative for weakness and numbness.       No saddle anesthesia  All other systems reviewed and are negative.   Physical Exam Updated Vital Signs BP 121/82 (BP Location: Left Arm)   Pulse 99   Temp 98.4 F (36.9 C) (Oral)   Resp 18  SpO2 98%   Physical Exam Physical Exam  Constitutional: Pt appears well-developed and well-nourished. No distress.  HENT:  Head: Normocephalic and atraumatic.  Mouth/Throat: Oropharynx is clear and moist. No oropharyngeal exudate.  Eyes: Conjunctivae are normal.  Neck: Normal range of motion. Neck supple.  No meningismus Cardiovascular: Normal rate, regular rhythm and intact distal pulses.   Pulmonary/Chest: Effort normal and breath sounds normal. No respiratory distress. Pt has no wheezes.  Abdominal: Pt exhibits no distension Musculoskeletal:  Left lumbar paraspinal muscles tender to palpation, no bony CTLS spine tenderness,  deformity, step-off, or crepitus Lymphadenopathy: Pt has no cervical adenopathy.  Neurological: Pt is alert and oriented Speech is clear and goal oriented, follows commands Normal 5/5 strength in upper and lower extremities bilaterally including dorsiflexion and plantar flexion, strong and equal grip strength Sensation intact Great toe extension intact Moves extremities without ataxia, coordination intact Normal gait Normal balance No Clonus Skin: Skin is warm and dry. No rash noted. Pt is not diaphoretic. No erythema.  Psychiatric: Pt has a normal mood and affect. Behavior is normal.  Nursing note and vitals reviewed.   ED Treatments / Results  DIAGNOSTIC STUDIES: Oxygen Saturation is 98% on RA, normal by my interpretation.    COORDINATION OF CARE: 12:05 PM-Discussed treatment plan which includes order of Flexeril with pt at bedside and pt agreed to plan.   Procedures Procedures (including critical care time)  Initial Impression / Assessment and Plan / ED Course  I have reviewed the triage vital signs and the nursing notes.  Pertinent labs & imaging results that were available during my care of the patient were reviewed by me and considered in my medical decision making (see chart for details).  Clinical Course    Patient with back pain.  No neurological deficits and normal neuro exam.  Patient is ambulatory.  No loss of bowel or bladder control.  Doubt cauda equina.  Denies fever,  doubt epidural abscess or other lesion. Recommend back exercises, stretching, RICE, and will treat with a short course of flexeril.  Encouraged the patient that there could be a need for additional workup and/or imaging such as MRI, if the symptoms do not resolve. Patient advised that if the back pain does not resolve, or radiates, this could progress to more serious conditions and is encouraged to follow-up with PCP or orthopedics within 2 weeks.     Final Clinical Impressions(s) / ED Diagnoses    Final diagnoses:  Lumbar strain, initial encounter  Muscle spasm of back   New Prescriptions New Prescriptions   CYCLOBENZAPRINE (FLEXERIL) 10 MG TABLET    Take 1 tablet (10 mg total) by mouth 2 (two) times daily as needed for muscle spasms.   I personally performed the services described in this documentation, which was scribed in my presence. The recorded information has been reviewed and is accurate.       Erik Horseman, PA-C 08/07/15 1224    Shaune Pollack, MD 08/07/15 2108

## 2015-08-07 NOTE — ED Notes (Signed)
Declined W/C at D/C and was escorted to lobby by RN. 

## 2015-08-07 NOTE — ED Triage Notes (Signed)
Pt reports heavy lifting today and now having left side back pain. Ambulatory at triage, no acute distress noted.

## 2015-08-08 ENCOUNTER — Emergency Department (HOSPITAL_COMMUNITY)
Admission: EM | Admit: 2015-08-08 | Discharge: 2015-08-08 | Disposition: A | Discharge status: Home / Self Care | Payer: Managed Care, Other (non HMO) | Attending: Emergency Medicine | Admitting: Emergency Medicine

## 2015-08-08 ENCOUNTER — Emergency Department (HOSPITAL_COMMUNITY): Payer: Managed Care, Other (non HMO)

## 2015-08-08 ENCOUNTER — Encounter (HOSPITAL_COMMUNITY): Payer: Self-pay | Admitting: Emergency Medicine

## 2015-08-08 DIAGNOSIS — S39012A Strain of muscle, fascia and tendon of lower back, initial encounter: Secondary | ICD-10-CM

## 2015-08-08 DIAGNOSIS — Y999 Unspecified external cause status: Secondary | ICD-10-CM | POA: Insufficient documentation

## 2015-08-08 DIAGNOSIS — Z7982 Long term (current) use of aspirin: Secondary | ICD-10-CM | POA: Insufficient documentation

## 2015-08-08 DIAGNOSIS — X501XXA Overexertion from prolonged static or awkward postures, initial encounter: Secondary | ICD-10-CM | POA: Insufficient documentation

## 2015-08-08 DIAGNOSIS — F17200 Nicotine dependence, unspecified, uncomplicated: Secondary | ICD-10-CM | POA: Insufficient documentation

## 2015-08-08 DIAGNOSIS — Y939 Activity, unspecified: Secondary | ICD-10-CM | POA: Insufficient documentation

## 2015-08-08 DIAGNOSIS — Z79899 Other long term (current) drug therapy: Secondary | ICD-10-CM | POA: Insufficient documentation

## 2015-08-08 DIAGNOSIS — Y929 Unspecified place or not applicable: Secondary | ICD-10-CM | POA: Insufficient documentation

## 2015-08-08 NOTE — ED Triage Notes (Signed)
Pt twisted back yesterday while lifting heavy object. Pt was seen at [facility] for this yesterday. Pt reports pain is worse today and wants re-eval. Pt ambulatory

## 2015-08-08 NOTE — ED Notes (Signed)
ED PA at bedside

## 2015-08-08 NOTE — ED Provider Notes (Signed)
[facility]EMERGENCY DEPT Provider Note   CSN: [id number] Arrival date & time: 08/08/15  1249  First Provider Contact:   First MD Initiated Contact with Patient 08/08/15 1357    By signing my name below, I, Octavia Heir, attest that this documentation has been prepared under the direction and in the presence of [employer], PA-C.  Electronically Signed: Octavia Heir, ED Scribe. 08/08/15. 2:04 PM.    History   Chief Complaint Chief Complaint  Patient presents with  . Back Pain    The history is provided by the patient. No language interpreter was used.   HPI Comments: Erik Bryan is a 30 y.o. male who has a ap PMhx of a-fib, enlarged heart, heart mumur and wolf-parkinson's white syndrome presents to the Emergency Department complaining of constant, gradual worsening, sharp shooting mid-back pain onset last night. Pt states he was remodeling at his house yesterday, when he lifted a toilet and slipped on some water. He notes feeling "something in his back give" and has been having increased pain since. Pt was evaluated at [facility] yesterday for his pain where he was prescribed flexeril and was told to rest. Pt says he has not taken the flexeril but notes taking advil to alleviate his pain with no relief. He states when he woke up this morning with increased pain. Pt reports his pain is increased when when goes from sitting to standing with the slightest movement. He denies numbness, tingling, radiation into legs, fever, or  bowel/bladder incontinence.  Past Medical History:  Diagnosis Date  . Atrial fibrillation (HCC)   . Enlarged heart   . 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 Wolf-Parkinson-White syndrome     Patient Active Problem List   Diagnosis Date Noted  . SHORTNESS OF BREATH 10/11/2008  . ANXIETY STATE, UNSPECIFIED 09/04/2008  . 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 SVT/ PSVT/ PAT 08/16/2008  . DIZZINESS 08/13/2008  . CHEST PAIN 08/13/2008     Past Surgical History:  Procedure Laterality Date  . A FLUTTER ABLATION    . CARDIAC SURGERY    . PULMONARY ARTERY BALLOON ANGIOPLASTY    . WISDOM TOOTH EXTRACTION         Home Medications    Prior to Admission medications   Medication Sig Start Date End Date Taking? Authorizing Provider  acetaminophen (TYLENOL) 325 MG tablet Take 325 mg by mouth every 6 (six) hours as needed for mild pain, moderate pain or headache.    Historical Provider, MD  aspirin 81 MG chewable tablet Chew 162 mg by mouth daily as needed for mild pain or headache.    Historical Provider, MD  benzonatate (TESSALON) 100 MG capsule Take 1 capsule (100 mg total) by mouth every 8 (eight) hours. 05/13/15   [name] Ward, PA-C  cyclobenzaprine (FLEXERIL) 10 MG tablet Take 1 tablet (10 mg total) by mouth 2 (two) times daily as needed for muscle spasms. 08/07/15   [name], PA-C  ibuprofen (ADVIL,MOTRIN) 200 MG tablet Take 200 mg by mouth every 6 (six) hours as needed for headache or moderate pain (pain). For pain    Historical Provider, MD    Family History History reviewed. No pertinent family history.  Social History Social History  Substance Use Topics  . Smoking status: Current Every Day Smoker    Packs/day: 0.00  . Smokeless tobacco: Not on file  . Alcohol use Yes     Comment: occasionally  Allergies   Review of patient's allergies indicates no known allergies.   Review of Systems Review of Systems  Constitutional: Negative for fever and unexpected weight change.  Gastrointestinal: Negative for constipation.       Neg for fecal incontinence  Genitourinary: Negative for difficulty urinating, flank pain and hematuria.       Negative for urinary incontinence or retention  Musculoskeletal: Positive for back pain.  Neurological: Negative for weakness and numbness.       Negative for saddle paresthesias      Physical Exam Updated Vital Signs BP 122/83 (BP Location: Left Arm)    Pulse 88   Temp 98 F (36.7 C) (Oral)   Resp 16   Ht 5\' 11"  (1.803 m)   Wt 170 lb (77.1 kg)   SpO2 98%   BMI 23.71 kg/m    Physical Exam  Constitutional: He appears well-developed and well-nourished.  HENT:  Head: Normocephalic and atraumatic.  Eyes: Conjunctivae are normal.  Neck: Normal range of motion.  Abdominal: Soft. There is no tenderness. There is no CVA tenderness.  Musculoskeletal:       Cervical back: He exhibits normal range of motion, no tenderness and no bony tenderness.       Thoracic back: He exhibits tenderness. He exhibits normal range of motion and no bony tenderness.       Lumbar back: He exhibits decreased range of motion (mild with flexion), tenderness and bony tenderness (upper L-spine, paraspinous, L>R). He exhibits no swelling and no edema.  No step-off noted with palpation of spine.   Neurological: He is alert. He has normal reflexes. No sensory deficit. He exhibits normal muscle tone.  5/5 strength in entire lower extremities bilaterally. No sensation deficit.   Skin: Skin is warm and dry.  Psychiatric: He has a normal mood and affect.  Nursing note and vitals reviewed.    ED Treatments / Results  DIAGNOSTIC STUDIES: Oxygen Saturation is 98% on RA, normal by my interpretation.  Dg Lumbar Spine Complete  Result Date: 08/08/2015 CLINICAL DATA:  Increasing back pain over the past 2 days following fall, initial encounter EXAM: LUMBAR SPINE - COMPLETE 4+ VIEW COMPARISON:  None. FINDINGS: There is no evidence of lumbar spine fracture. Alignment is normal. Intervertebral disc spaces are maintained. IMPRESSION: No acute abnormality noted. Electronically Signed   By: Alcide Clever M.D.   On: 08/08/2015 15:09    COORDINATION OF CARE:  2:03 PM Will order x-ray of lumbar back at patient request. We discussed risks and benefits of imaging and that this would not likely demonstrate any acute cause of his pain. Patient like to proceed regardless for peace of mind.  Discussed treatment plan which includes naproxen, rest, and heating pads with pt at bedside and pt agreed to plan.  Procedures Procedures (including critical care time)   Initial Impression / Assessment and Plan / ED Course  I have reviewed the triage vital signs and the nursing notes.  Pertinent labs & imaging results that were available during my care of the patient were reviewed by me and considered in my medical decision making (see chart for details).   Vital signs reviewed and are as follows: Vitals:   08/08/15 1258 08/08/15 1526  BP: 122/83 112/76  Pulse: 88 64  Resp: 16 16  Temp: 98 F (36.7 C) 97.8 F (36.6 C)    No red flag s/s of low back pain. Patient was counseled on back pain precautions and told to do activity as  tolerated but do not lift, push, or pull heavy objects more than 10 pounds for the next week.  Patient counseled to use ice or heat on back for no longer than 15 minutes every hour.   He can take Flexeril prescribed yesterday if desired.   Patient urged to follow-up with PCP if pain does not improve with treatment and rest or if pain becomes recurrent. Urged to return with worsening severe pain, loss of bowel or bladder control, trouble walking.   The patient verbalizes understanding and agrees with the plan.    Final Clinical Impressions(s) / ED Diagnoses   Final diagnoses:  Low back strain, initial encounter   Patient with back pain. No neurological deficits. Imaging neg. Patient is ambulatory. No warning symptoms of back pain including: fecal incontinence, urinary retention or overflow incontinence, night sweats, waking from sleep with back pain, unexplained fevers or weight loss, h/o cancer, IVDU, recent trauma. No concern for cauda equina, epidural abscess, or other serious cause of back pain. Conservative measures such as rest, ice/heat and pain medicine indicated with PCP follow-up if no improvement with conservative management.   I personally  performed the services described in this documentation, which was scribed in my presence. The recorded information has been reviewed and is accurate.  New Prescriptions New Prescriptions   No medications on file     Renne Crigler, PA-C 08/08/15 1532    Laurence Spates, MD 08/08/15 [phone]

## 2015-08-08 NOTE — Discharge Instructions (Signed)
Please read and follow all provided instructions.  Your diagnoses today include:  1. Low back strain, initial encounter     Tests performed today include:  Vital signs - see below for your results today  X-ray - no severe injuries  Medications prescribed:   None  Take any prescribed medications only as directed.  Home care instructions:   Follow any educational materials contained in this packet  Please rest, use ice or heat on your back for the next several days  Do not lift, push, pull anything more than 10 pounds for the next week  Follow-up instructions: Please follow-up with your primary care provider in the next 1 week for further evaluation of your symptoms.   Return instructions:  SEEK IMMEDIATE MEDICAL ATTENTION IF YOU HAVE:  New numbness, tingling, weakness, or problem with the use of your arms or legs  Severe back pain not relieved with medications  Loss control of your bowels or bladder  Increasing pain in any areas of the body (such as chest or abdominal pain)  Shortness of breath, dizziness, or fainting.   Worsening nausea (feeling sick to your stomach), vomiting, fever, or sweats  Any other emergent concerns regarding your health   Additional Information:  Your vital signs today were: BP 122/83 (BP Location: Left Arm)    Pulse 88    Temp 98 F (36.7 C) (Oral)    Resp 16    Ht 5\' 11"  (1.803 m)    Wt 77.1 kg    SpO2 98%    BMI 23.71 kg/m  If your blood pressure (BP) was elevated above 135/85 this visit, please have this repeated by your doctor within one month. --------------

## 2020-02-25 ENCOUNTER — Encounter (HOSPITAL_COMMUNITY): Payer: Self-pay

## 2020-02-25 ENCOUNTER — Other Ambulatory Visit: Payer: Self-pay

## 2020-02-25 ENCOUNTER — Ambulatory Visit (HOSPITAL_COMMUNITY)
Admission: EM | Admit: 2020-02-25 | Discharge: 2020-02-25 | Disposition: A | Discharge status: Home / Self Care | Payer: BC Managed Care – PPO | Attending: Family Medicine | Admitting: Family Medicine

## 2020-02-25 DIAGNOSIS — S29019A Strain of muscle and tendon of unspecified wall of thorax, initial encounter: Secondary | ICD-10-CM

## 2020-02-25 MED ORDER — CYCLOBENZAPRINE HCL 10 MG PO TABS: 10.0000 mg | ORAL_TABLET | Freq: Three times a day (TID) | ORAL | 0 refills | Status: AC | PRN

## 2020-02-25 NOTE — ED Triage Notes (Signed)
Pt presents with lower back pain x 2 days. Pt states he was lifting heavy objects and stated the pain became worse. Pt states he took Advil today.

## 2020-02-25 NOTE — ED Provider Notes (Signed)
[facility]    CSN: [id number] Arrival date & time: 02/25/20  1542      History   Chief Complaint Chief Complaint  Patient presents with  . Back Pain    HPI Erik Bryan is a 35 y.o. male.   Here today with 5 day history of low back pain that started after doing some heavy yardwork. States after rest, heat, advil and massage pain started to ease up but yesterday he was moving some boxes in his storage unit and re-strained the area and having persistent 8/10 pain of left mid back that is worse with bending and walking/movements. Hot showers and NSAIDs do seem to help. Denies fever, chills, radiation of pain down legs, numbness, tingling, bowel or bladder incontinence.     Past Medical History:  Diagnosis Date  . Atrial fibrillation (HCC)   . Enlarged heart   . 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 Wolf-Parkinson-White syndrome     Patient Active Problem List   Diagnosis Date Noted  . SHORTNESS OF BREATH 10/11/2008  . ANXIETY STATE, UNSPECIFIED 09/04/2008  . 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 SVT/ PSVT/ PAT 08/16/2008  . DIZZINESS 08/13/2008  . CHEST PAIN 08/13/2008    Past Surgical History:  Procedure Laterality Date  . A FLUTTER ABLATION    . CARDIAC SURGERY    . PULMONARY ARTERY BALLOON ANGIOPLASTY    . WISDOM TOOTH EXTRACTION         Home Medications    Prior to Admission medications   Medication Sig Start Date End Date Taking? Authorizing Provider  cyclobenzaprine (FLEXERIL) 10 MG tablet Take 1 tablet (10 mg total) by mouth 3 (three) times daily as needed for muscle spasms. DO NOT DRINK ALCOHOL OR DRIVE WHILE TAKING THIS MEDICATION 02/25/20  Yes Particia Nearing, PA-C  ibuprofen (ADVIL,MOTRIN) 200 MG tablet Take 200 mg by mouth every 6 (six) hours as needed for headache or moderate pain (pain). For pain   Yes [provider]  acetaminophen (TYLENOL) 325 MG tablet Take 325 mg by mouth every 6  (six) hours as needed for mild pain, moderate pain or headache.    [provider]  aspirin 81 MG chewable tablet Chew 162 mg by mouth daily as needed for mild pain or headache.    [provider]  benzonatate (TESSALON) 100 MG capsule Take 1 capsule (100 mg total) by mouth every 8 (eight) hours. 05/13/15   Ward, [name], PA-C  cyclobenzaprine (FLEXERIL) 10 MG tablet Take 1 tablet (10 mg total) by mouth 2 (two) times daily as needed for muscle spasms. 08/07/15   [name], PA-C    Family History History reviewed. No pertinent family history.  Social History Social History   Tobacco Use  . Smoking status: Current Every Day Smoker    Packs/day: 0.00  . Smokeless tobacco: Never Used  Substance Use Topics  . Alcohol use: Yes    Comment: occasionally  . Drug use: No     Allergies   Patient has no known allergies.   Review of Systems Review of Systems PER HPI    Physical Exam Triage Vital Signs ED Triage Vitals  Enc Vitals Group     BP 02/25/20 1628 112/73     Pulse Rate 02/25/20 1628 80     Resp 02/25/20 1628 17     Temp 02/25/20 1628 97.7 F (36.5 C)     Temp Source 02/25/20  1628 Oral     SpO2 02/25/20 1628 98 %     Weight --      Height --      Head Circumference --      Peak Flow --      Pain Score 02/25/20 1627 4     Pain Loc --      Pain Edu? --      Excl. in GC? --    No data found.  Updated Vital Signs BP 112/73 (BP Location: Left Arm)   Pulse 80   Temp 97.7 F (36.5 C) (Oral)   Resp 17   SpO2 98%   Visual Acuity Right Eye Distance:   Left Eye Distance:   Bilateral Distance:    Right Eye Near:   Left Eye Near:    Bilateral Near:     Physical Exam Vitals and nursing note reviewed.  Constitutional:      Appearance: Normal appearance.  HENT:     Head: Atraumatic.  Eyes:     Extraocular Movements: Extraocular movements intact.     Conjunctiva/sclera: Conjunctivae normal.  Cardiovascular:     Rate and Rhythm:  Normal rate and regular rhythm.     Heart sounds: Normal heart sounds.  Pulmonary:     Effort: Pulmonary effort is normal.     Breath sounds: Normal breath sounds.  Abdominal:     General: Bowel sounds are normal. There is no distension.     Palpations: Abdomen is soft.     Tenderness: There is no abdominal tenderness. There is no right CVA tenderness, left CVA tenderness or guarding.  Musculoskeletal:        General: Tenderness (left lateral thoracic musculature ttp) present. No swelling, deformity or signs of injury. Normal range of motion.     Cervical back: Normal range of motion and neck supple.  Skin:    General: Skin is warm and dry.     Findings: No bruising, erythema or rash.  Neurological:     General: No focal deficit present.     Mental Status: He is oriented to person, place, and time.     Cranial Nerves: No cranial nerve deficit.     Motor: No weakness.     Gait: Gait normal.  Psychiatric:        Mood and Affect: Mood normal.        Thought Content: Thought content normal.        Judgment: Judgment normal.      UC Treatments / Results  Labs (all labs ordered are listed, but only abnormal results are displayed) Labs Reviewed - No data to display  EKG   Radiology No results found.  Procedures Procedures (including critical care time)  Medications Ordered in UC Medications - No data to display  Initial Impression / Assessment and Plan / UC Course  I have reviewed the triage vital signs and the nursing notes.  Pertinent labs & imaging results that were available during my care of the patient were reviewed by me and considered in my medical decision making (see chart for details).     Consistent with muscular strain - will treat with flexeril, continued OTC pain relievers, stretches, heat. WOrk note given. Return for acutely worsening sxs.   Final Clinical Impressions(s) / UC Diagnoses   Final diagnoses:  Thoracic myofascial strain, initial encounter    Discharge Instructions   None    ED Prescriptions    Medication Sig Dispense Auth. Provider   cyclobenzaprine (  FLEXERIL) 10 MG tablet Take 1 tablet (10 mg total) by mouth 3 (three) times daily as needed for muscle spasms. DO NOT DRINK ALCOHOL OR DRIVE WHILE TAKING THIS MEDICATION 15 tablet Particia Nearing, [place]     PDMP not reviewed this encounter.   Particia Nearing, [place] 02/25/20 [phone]
